# Patient Record
Sex: Female | Born: 1935 | Race: White | Hispanic: No | Smoking: Never smoker
Health system: Southern US, Community
[De-identification: ages and names within clinical notes are randomized; demographics above are authoritative.]

## PROBLEM LIST (undated history)

## (undated) DIAGNOSIS — K449 Diaphragmatic hernia without obstruction or gangrene: Secondary | ICD-10-CM

## (undated) DIAGNOSIS — E079 Disorder of thyroid, unspecified: Secondary | ICD-10-CM

## (undated) DIAGNOSIS — I701 Atherosclerosis of renal artery: Secondary | ICD-10-CM

## (undated) DIAGNOSIS — I6529 Occlusion and stenosis of unspecified carotid artery: Secondary | ICD-10-CM

## (undated) DIAGNOSIS — I509 Heart failure, unspecified: Secondary | ICD-10-CM

## (undated) DIAGNOSIS — I10 Essential (primary) hypertension: Secondary | ICD-10-CM

## (undated) DIAGNOSIS — N189 Chronic kidney disease, unspecified: Secondary | ICD-10-CM

## (undated) DIAGNOSIS — K219 Gastro-esophageal reflux disease without esophagitis: Secondary | ICD-10-CM

## (undated) HISTORY — DX: Occlusion and stenosis of unspecified carotid artery: I65.29

## (undated) HISTORY — PX: CORONARY ARTERY BYPASS GRAFT: SHX141

## (undated) HISTORY — DX: Disorder of thyroid, unspecified: E07.9

## (undated) HISTORY — DX: Gastro-esophageal reflux disease without esophagitis: K21.9

## (undated) HISTORY — DX: Essential (primary) hypertension: I10

## (undated) HISTORY — DX: Chronic kidney disease, unspecified: N18.9

## (undated) HISTORY — DX: Atherosclerosis of renal artery: I70.1

## (undated) HISTORY — DX: Heart failure, unspecified: I50.9

## (undated) HISTORY — DX: Diaphragmatic hernia without obstruction or gangrene: K44.9

---

## 1991-08-27 HISTORY — PX: ABDOMINAL HYSTERECTOMY: SHX81

## 2000-11-27 ENCOUNTER — Encounter: Payer: Self-pay | Admitting: Emergency Medicine

## 2000-11-27 ENCOUNTER — Inpatient Hospital Stay (HOSPITAL_COMMUNITY): Admission: EM | Admit: 2000-11-27 | Discharge: 2000-12-03 | Payer: Self-pay | Admitting: Emergency Medicine

## 2002-04-05 ENCOUNTER — Encounter: Payer: Self-pay | Admitting: Emergency Medicine

## 2002-04-05 ENCOUNTER — Emergency Department (HOSPITAL_COMMUNITY): Admission: EM | Admit: 2002-04-05 | Discharge: 2002-04-05 | Payer: Self-pay | Admitting: Emergency Medicine

## 2003-07-09 ENCOUNTER — Emergency Department (HOSPITAL_COMMUNITY): Admission: EM | Admit: 2003-07-09 | Discharge: 2003-07-09 | Payer: Self-pay | Admitting: Emergency Medicine

## 2004-01-22 ENCOUNTER — Emergency Department (HOSPITAL_COMMUNITY): Admission: EM | Admit: 2004-01-22 | Discharge: 2004-01-22 | Payer: Self-pay | Admitting: Emergency Medicine

## 2004-03-07 ENCOUNTER — Ambulatory Visit (HOSPITAL_COMMUNITY): Admission: RE | Admit: 2004-03-07 | Discharge: 2004-03-07 | Payer: Self-pay | Admitting: *Deleted

## 2004-03-16 ENCOUNTER — Ambulatory Visit (HOSPITAL_COMMUNITY): Admission: RE | Admit: 2004-03-16 | Discharge: 2004-03-17 | Payer: Self-pay | Admitting: *Deleted

## 2004-12-25 ENCOUNTER — Encounter: Admission: RE | Admit: 2004-12-25 | Discharge: 2004-12-25 | Payer: Self-pay | Admitting: Internal Medicine

## 2007-09-24 ENCOUNTER — Ambulatory Visit: Payer: Self-pay | Admitting: *Deleted

## 2008-03-10 ENCOUNTER — Ambulatory Visit: Payer: Self-pay | Admitting: *Deleted

## 2008-03-11 ENCOUNTER — Inpatient Hospital Stay (HOSPITAL_COMMUNITY): Admission: EM | Admit: 2008-03-11 | Discharge: 2008-03-16 | Payer: Self-pay | Admitting: Emergency Medicine

## 2008-03-12 ENCOUNTER — Encounter (INDEPENDENT_AMBULATORY_CARE_PROVIDER_SITE_OTHER): Payer: Self-pay | Admitting: Internal Medicine

## 2008-03-12 ENCOUNTER — Ambulatory Visit: Payer: Self-pay | Admitting: *Deleted

## 2008-09-15 ENCOUNTER — Ambulatory Visit: Payer: Self-pay | Admitting: *Deleted

## 2008-09-29 ENCOUNTER — Ambulatory Visit: Payer: Self-pay | Admitting: *Deleted

## 2008-10-17 ENCOUNTER — Encounter (INDEPENDENT_AMBULATORY_CARE_PROVIDER_SITE_OTHER): Payer: Self-pay | Admitting: *Deleted

## 2008-10-17 ENCOUNTER — Inpatient Hospital Stay (HOSPITAL_COMMUNITY): Admission: RE | Admit: 2008-10-17 | Discharge: 2008-10-18 | Payer: Self-pay | Admitting: *Deleted

## 2008-10-17 ENCOUNTER — Ambulatory Visit: Payer: Self-pay | Admitting: *Deleted

## 2008-10-17 HISTORY — PX: CAROTID ENDARTERECTOMY: SUR193

## 2008-11-03 ENCOUNTER — Ambulatory Visit: Payer: Self-pay | Admitting: *Deleted

## 2009-05-08 ENCOUNTER — Ambulatory Visit: Payer: Self-pay | Admitting: Vascular Surgery

## 2009-06-07 ENCOUNTER — Emergency Department (HOSPITAL_COMMUNITY): Admission: EM | Admit: 2009-06-07 | Discharge: 2009-06-08 | Payer: Self-pay | Admitting: Emergency Medicine

## 2009-06-19 ENCOUNTER — Observation Stay (HOSPITAL_COMMUNITY): Admission: EM | Admit: 2009-06-19 | Discharge: 2009-06-20 | Payer: Self-pay | Admitting: Emergency Medicine

## 2009-06-20 ENCOUNTER — Encounter (INDEPENDENT_AMBULATORY_CARE_PROVIDER_SITE_OTHER): Payer: Self-pay | Admitting: Internal Medicine

## 2009-11-21 ENCOUNTER — Ambulatory Visit: Payer: Self-pay | Admitting: Vascular Surgery

## 2010-07-06 ENCOUNTER — Ambulatory Visit: Payer: Self-pay | Admitting: Vascular Surgery

## 2010-09-16 ENCOUNTER — Encounter: Payer: Self-pay | Admitting: Internal Medicine

## 2010-11-29 LAB — URINE CULTURE
Colony Count: NO GROWTH
Colony Count: >=100000
Culture: NO GROWTH

## 2010-11-29 LAB — BASIC METABOLIC PANEL
BUN: 8 mg/dL (ref 6–23)
BUN: 12 mg/dL (ref 6–23)
CO2: 27 mEq/L (ref 19–32)
CO2: 25 mEq/L (ref 19–32)
Chloride: 105 mEq/L (ref 96–112)
GFR calc non Af Amer: 57 mL/min — ABNORMAL LOW (ref >60)
Glucose, Bld: 130 mg/dL — ABNORMAL HIGH (ref 70–99)
Potassium: 4.6 mEq/L (ref 3.5–5.1)
Sodium: 141 mEq/L (ref 135–145)

## 2010-11-29 LAB — DIFFERENTIAL
Basophils Absolute: 0 10*3/uL (ref 0.0–0.1)
Basophils Relative: 0 % (ref 0–1)
Eosinophils Absolute: 0.1 10*3/uL (ref 0.0–0.7)
Eosinophils Absolute: 0.1 10*3/uL (ref 0.0–0.7)
Eosinophils Relative: 2 % (ref 0–5)
Lymphocytes Relative: 9 % — ABNORMAL LOW (ref 12–46)
Lymphs Abs: 0.6 10*3/uL — ABNORMAL LOW (ref 0.7–4.0)
Monocytes Absolute: 0.3 10*3/uL (ref 0.1–1.0)
Monocytes Relative: 6 % (ref 3–12)
Neutro Abs: 5.1 10*3/uL (ref 1.7–7.7)
Neutrophils Relative %: 84 % — ABNORMAL HIGH (ref 43–77)

## 2010-11-29 LAB — POCT I-STAT, CHEM 8
BUN: 15 mg/dL (ref 6–23)
Calcium, Ion: 1.06 mmol/L — ABNORMAL LOW (ref 1.12–1.32)
Chloride: 105 mEq/L (ref 96–112)
Glucose, Bld: 123 mg/dL — ABNORMAL HIGH (ref 70–99)
HCT: 38 % (ref 36.0–46.0)
Potassium: 4.3 mEq/L (ref 3.5–5.1)
TCO2: 23 mmol/L (ref 0–100)

## 2010-11-29 LAB — URINALYSIS, ROUTINE W REFLEX MICROSCOPIC
Bilirubin Urine: NEGATIVE
Glucose, UA: NEGATIVE mg/dL
Glucose, UA: NEGATIVE mg/dL
Hgb urine dipstick: NEGATIVE
Hgb urine dipstick: NEGATIVE
Ketones, ur: NEGATIVE mg/dL
Nitrite: NEGATIVE
Nitrite: POSITIVE — AB
Protein, ur: NEGATIVE mg/dL
Specific Gravity, Urine: 1.012 (ref 1.005–1.030)
Urobilinogen, UA: 0.2 mg/dL (ref 0.0–1.0)
Urobilinogen, UA: 0.2 mg/dL (ref 0.0–1.0)
pH: 6 (ref 5.0–8.0)

## 2010-11-29 LAB — CBC
HCT: 36.7 % (ref 36.0–46.0)
HCT: 35.3 % — ABNORMAL LOW (ref 36.0–46.0)
Hemoglobin: 12.6 g/dL (ref 12.0–15.0)
Hemoglobin: 12.6 g/dL (ref 12.0–15.0)
Hemoglobin: 11.9 g/dL — ABNORMAL LOW (ref 12.0–15.0)
MCHC: 34.2 g/dL (ref 30.0–36.0)
MCHC: 34.2 g/dL (ref 30.0–36.0)
MCHC: 33.7 g/dL (ref 30.0–36.0)
MCV: 94.7 fL (ref 78.0–100.0)
MCV: 95.1 fL (ref 78.0–100.0)
MCV: 94.4 fL (ref 78.0–100.0)
Platelets: 175 10*3/uL (ref 150–400)
Platelets: 178 10*3/uL (ref 150–400)
Platelets: 164 10*3/uL (ref 150–400)
RBC: 3.86 MIL/uL — ABNORMAL LOW (ref 3.87–5.11)
RBC: 3.74 MIL/uL — ABNORMAL LOW (ref 3.87–5.11)
RDW: 13.6 % (ref 11.5–15.5)
RDW: 13.5 % (ref 11.5–15.5)
WBC: 5.6 10*3/uL (ref 4.0–10.5)
WBC: 6.2 10*3/uL (ref 4.0–10.5)

## 2010-11-29 LAB — POCT CARDIAC MARKERS
Myoglobin, poc: 84.5 ng/mL (ref 12–200)
Troponin i, poc: <0.05 ng/mL (ref 0.00–0.09)

## 2010-11-29 LAB — URINE MICROSCOPIC-ADD ON

## 2010-11-29 LAB — LIPID PANEL
LDL Cholesterol: 125 mg/dL — ABNORMAL HIGH (ref 0–99)
Total CHOL/HDL Ratio: 4 RATIO
Triglycerides: 61 mg/dL (ref <150)

## 2010-11-29 LAB — HOMOCYSTEINE: Homocysteine: 11.3 umol/L (ref 4.0–15.4)

## 2010-12-11 LAB — CBC
HCT: 30.9 % — ABNORMAL LOW (ref 36.0–46.0)
Hemoglobin: 10.6 g/dL — ABNORMAL LOW (ref 12.0–15.0)
MCHC: 34.3 g/dL (ref 30.0–36.0)
MCV: 94.8 fL (ref 78.0–100.0)
Platelets: 175 10*3/uL (ref 150–400)
RBC: 3.27 MIL/uL — ABNORMAL LOW (ref 3.87–5.11)
RDW: 13.9 % (ref 11.5–15.5)
RDW: 14.4 % (ref 11.5–15.5)
WBC: 5 10*3/uL (ref 4.0–10.5)

## 2010-12-11 LAB — URINALYSIS, ROUTINE W REFLEX MICROSCOPIC
Bilirubin Urine: NEGATIVE
Nitrite: POSITIVE — AB
Protein, ur: NEGATIVE mg/dL
Specific Gravity, Urine: 1.015 (ref 1.005–1.030)
Urobilinogen, UA: 0.2 mg/dL (ref 0.0–1.0)

## 2010-12-11 LAB — URINE MICROSCOPIC-ADD ON

## 2010-12-11 LAB — BASIC METABOLIC PANEL
CO2: 27 mEq/L (ref 19–32)
Calcium: 8.4 mg/dL (ref 8.4–10.5)
Chloride: 104 mEq/L (ref 96–112)
GFR calc Af Amer: >60 mL/min (ref >60)
Glucose, Bld: 119 mg/dL — ABNORMAL HIGH (ref 70–99)
Potassium: 4.1 mEq/L (ref 3.5–5.1)
Sodium: 137 mEq/L (ref 135–145)

## 2010-12-11 LAB — COMPREHENSIVE METABOLIC PANEL
AST: 22 U/L (ref 0–37)
Albumin: 3.7 g/dL (ref 3.5–5.2)
Alkaline Phosphatase: 62 U/L (ref 39–117)
Chloride: 103 mEq/L (ref 96–112)
GFR calc Af Amer: >60 mL/min (ref >60)
Potassium: 3.7 mEq/L (ref 3.5–5.1)
Total Bilirubin: 0.8 mg/dL (ref 0.3–1.2)
Total Protein: 6.3 g/dL (ref 6.0–8.3)

## 2010-12-11 LAB — APTT: aPTT: 31 seconds (ref 24–37)

## 2010-12-11 LAB — ABO/RH: ABO/RH(D): A POS

## 2010-12-11 LAB — TYPE AND SCREEN: Antibody Screen: NEGATIVE

## 2011-01-08 NOTE — H&P (Signed)
[name], [name]                ACCOUNT NO.:  [id number]   MEDICAL RECORD NO.:  [id number]          PATIENT TYPE:  INP   LOCATION:  2550                         FACILITY:  [facility]   PHYSICIAN:  Balinda Quails, M.D.    DATE OF BIRTH:  08-03-1918   DATE OF ADMISSION:  10/17/2008  DATE OF DISCHARGE:                              HISTORY & PHYSICAL   ADMISSION DIAGNOSIS:  Severe right internal carotid artery stenosis.   HISTORY:  [name] is an 77 year old female with a known carotid  vascular occlusive disease.  She has been followed in the office since  1998.  Underwent an arteriogram in 2005, this revealed approximately 70%  right internal carotid artery stenosis and conservative management was  pursued.  She is followed up regularly in the office with carotid  Dopplers.  She has remained asymptomatic.  Denies a history of stroke.  No sensory, motor, or visual deficit.   Recent carotid Doppler, however, revealed significant progression of  right internal carotid artery stenosis.  Doppler carried out on September 15, 2008 reveals velocities of 430/127 cm/sec in the right carotid  bifurcation with calcific plaque.   PAST MEDICAL HISTORY:  1. Noncardiac chest pain.  2. Peripheral vascular disease.  3. Hypertension.  4. Congestive heart failure.  5. Hypothyroidism.  6. Chronic kidney disease, stage III.   MEDICATIONS:  1. Aspirin 81 mg daily.  2. Levoxyl 75 mcg daily.   ALLERGIES:  CODEINE, MORPHINE, and LASIX.   SOCIAL HISTORY:  The patient was alone.  She has two very supportive  daughters.  Nonsmoker and nondrinker.   FAMILY HISTORY:  Noncontributory.   REVIEW OF SYSTEMS:  The patient denies recent chest pain or shortness of  breath.  No cough or sputum production.  No recent weight loss.  Regular  bowel habits.  No abdominal pain.  No dysuria or frequency.   PHYSICAL EXAMINATION:  GENERAL:  Well-appearing 77 year old female,  alert, and oriented.  No acute  distress.  VITAL SIGNS:  BP is 161/85, pulse is 62 per minute, respirations 20 per  minute, temperature 98, and O2 sat 100%.  HEENT:  Unremarkable.  NECK:  Supple.  No thyromegaly or adenopathy.  CARDIOVASCULAR:  Normal heart sounds without murmurs.  No gallops or  rubs.  Regular rate and rhythm.  CHEST:  Distant breath sounds.  Equal entry bilaterally.  No rales or  rhonchi.  ABDOMEN:  Soft and nontender.  No masses or organomegaly.  EXTREMITIES:  Full range of motion, no ankle edema.  NEUROLOGIC:  Cranial nerves intact.  Strength equal bilaterally.  1+  reflexes.   IMPRESSION:  1. Asymptomatic severe right internal carotid artery stenosis.  2. Hypertension.  3. Congestive heart failure.  4. Noncardiac chest pain.  5. Hypothyroidism.  6. Chronic kidney disease, stage III.   ADMISSION PLAN:  The patient will be electively admitted to [facility] for right carotid endarterectomy for reduction of stroke risk.      Balinda Quails, M.D.  Electronically Signed     PGH/MEDQ  D:  10/17/2008  T:  10/18/2008  Job:  [id number]   cc:   Gerlene Burdock A. Alanda Amass, M.D.

## 2011-01-08 NOTE — Procedures (Signed)
CAROTID DUPLEX EXAM   INDICATION:  Follow up carotid artery disease.   HISTORY:  Diabetes:  No.  Cardiac:  CABG.  Hypertension:  Yes.  Smoking:  No.  Previous Surgery:  No.  CV History:  No.  Amaurosis Fugax No, Paresthesias No, Hemiparesis No.                                       RIGHT             LEFT  Brachial systolic pressure:         150               122  Brachial Doppler waveforms:         Normal            Monophasic  Vertebral direction of flow:        Antegrade         Not visualized  DUPLEX VELOCITIES (cm/sec)  CCA peak systolic                   108               74  ECA peak systolic                   68                282  ICA peak systolic                   132               133  ICA end diastolic                   68                52  PLAQUE MORPHOLOGY:                  Calcific/irregular                  Calcific  PLAQUE AMOUNT:                      Moderate/severe   Moderate/severe  PLAQUE LOCATION:                    ICA/ECA/CCA       ICA/ECA/CCA   IMPRESSION:  1. 60-79% stenosis of the bilateral internal carotid arteries.  2. Turbulent, monophasic flow noted in the left subclavian artery,      which is suggestive of a significant proximal stenosis.  3. No significant change noted from the previous examination on      09/24/07.   ___________________________________________  P. Liliane Bade, M.D.   CH/MEDQ  D:  03/10/2008  T:  03/10/2008  Job:  [id number]

## 2011-01-08 NOTE — Assessment & Plan Note (Signed)
OFFICE VISIT   [name], [name]  DOB:  Nov 23, 1917                                       11/03/2008  NWGNF#:[id number]   The patient underwent right carotid endarterectomy 10/17/2008 at [facility].  This was carried out for severe asymptomatic right  internal carotid artery stenosis which had progressed.  She was  discharged home on postop day #1.  She has had some mild headache since  discharge.  No other complaints.   Right neck incision healing well.  BP is 154/83, pulse 73 per minute.  Cranial nerves intact.  Strength equal bilaterally.   The patient reassured regarding her headache.  Will plan followup in 6  months with carotid duplex.   Balinda Quails, M.D.  Electronically Signed   PGH/MEDQ  D:  11/03/2008  T:  11/04/2008  Job:  [id number]   cc:   Gerlene Burdock A. Alanda Amass, M.D.

## 2011-01-08 NOTE — Procedures (Signed)
CAROTID DUPLEX EXAM   INDICATION:  Followup carotid artery disease.   HISTORY:  Diabetes:  No.  Cardiac:  CABG.  Hypertension:  Yes.  Smoking:  No.  Previous Surgery:  No.  CV History:  No.  Amaurosis Fugax No, Paresthesias No, Hemiparesis No                                       RIGHT             LEFT  Brachial systolic pressure:         155               124  Brachial Doppler waveforms:         WNL               Monophasic  Vertebral direction of flow:        Antegrade         Not visualized  DUPLEX VELOCITIES (cm/sec)  CCA peak systolic                   67                76  ECA peak systolic                   175               252  ICA peak systolic                   430               319  ICA end diastolic                   127               71  PLAQUE MORPHOLOGY:                  Calcific          Calcific  PLAQUE AMOUNT:                      Severe            Moderate/severe  PLAQUE LOCATION:                    ICA/ECA/CCA       ICA/ECA/CCA   IMPRESSION:  1. Right ICA shows evidence of 80-99% stenosis at origin, an increase      from previous study.  2. Left ICA shows evidence of 60-79% stenosis and appears fairly      stable.  3. Left ECA stenosis.  4. Bilateral tortuous ICAs; left ICA acoustic shadowing.  5. Dr. Madilyn Fireman was informed of results and appointment scheduled to see      him 09/29/2008.   ___________________________________________  P. Liliane Bade, M.D.   AS/MEDQ  D:  09/15/2008  T:  09/15/2008  Job:  [id number]

## 2011-01-08 NOTE — Procedures (Signed)
CAROTID DUPLEX EXAM   INDICATION:  Follow up carotid artery disease.   HISTORY:  Diabetes:  No.  Cardiac:  CABG.  Hypertension:  Yes.  Smoking:  No.  Previous Surgery:  Right carotid endarterectomy, 10/17/2008, with a DPA  by Dr. Madilyn Fireman.  CV History:  Patient is currently asymptomatic.  Amaurosis Fugax No, Paresthesias No, Hemiparesis No.                                       RIGHT             LEFT  Brachial systolic pressure:         140               120  Brachial Doppler waveforms:         WNL               Monophasic  Vertebral direction of flow:        Antegrade         Not visualized  DUPLEX VELOCITIES (cm/sec)  CCA peak systolic                   48                62  ECA peak systolic                   64                271  ICA peak systolic                   49                143  ICA end diastolic                   15                30  PLAQUE MORPHOLOGY:                                    Heterogenous  PLAQUE AMOUNT:                                        Moderate  PLAQUE LOCATION:                                      ICA, ECA   IMPRESSION:  1. Patent and durable right carotid endarterectomy with no      hemodynamically significant stenosis.  2. 40% to 59% stenosis of the left internal carotid artery.  3. Left external carotid artery stenosis.  4. There is an improvement in velocities compared to previous studies.   ___________________________________________  Leonides Sake, MD   OD/MEDQ  D:  07/09/2010  T:  07/09/2010  Job:  [id number]

## 2011-01-08 NOTE — H&P (Signed)
[name], [name] NO.:  [id number]   MEDICAL RECORD NO.:  [id number]          PATIENT TYPE:  INP   LOCATION:  1830                         FACILITY:  [facility]   PHYSICIAN:  Della Goo, M.D. DATE OF BIRTH:  08/01/1918   DATE OF ADMISSION:  03/11/2008  DATE OF DISCHARGE:                              HISTORY & PHYSICAL   PRIMARY CARE PHYSICIAN:  Merlene Laughter. Renae Gloss, M.D. [facility].   CHIEF COMPLAINT:  Chest pain.   HISTORY OF PRESENT ILLNESS:  This is an 77 year old female presenting to  the emergency department with complaints of severe chest pain,  substernal in location without radiation.  This started at about 1:00  p.m.  She reports having associated symptoms of nausea and vomiting.  She called her daughters and told them of her symptoms and EMS was  called and the patient was transported to the emergency department at  about 4:56 p.m.  The patient also reported feeling sick since the a.m.  and feeling sluggish and just not feeling well.   PAST MEDICAL HISTORY:  Significant for coronary artery disease, history  of coronary artery bypass grafting in 1997, osteoporosis, and  hypothyroidism.   MEDICATIONS:  Include levothyroxine 0.1 mg 1 p.o. daily, aspirin 81 mg 1  p.o. daily and cranberry tablets 1 p.o. daily.   ALLERGIES:  CODEINE WHICH CAUSES NAUSEA AND VOMITING AND ALLERGY TO  LASIX.  THIS IS AN INTOLERANCE.  HER DAUGHTER'S REPORT THAT SHE GETS  SEVERE CRAMPING AND CONFUSION.   SOCIAL HISTORY:  The patient lives alone.  She is a nonsmoker,  nondrinker.   FAMILY HISTORY:  Noncontributory.   REVIEW OF SYSTEMS:  Pertinents are mentioned above.   PHYSICAL EXAMINATION:  This is an elderly 77 year old female in no  discomfort or acute distress currently.  VITAL SIGNS:  Temperature 97.4, blood pressure 150/93, heart rate 85,  respirations 18, O2 sats 94% to 98%.  HEENT:  Normocephalic, atraumatic.   Pupils are equally round, reactive  to light.  Extraocular movements are intact.  Funduscopic benign.  There  is no scleral icterus.  Oropharynx is clear.  The patient is edentulous.  She has dentures on the upper and lower.  NECK:  Supple.  Full range of motion.  No thyromegaly, adenopathy or  jugular venous distention.  CARDIOVASCULAR:  Regular rate and rhythm.  No murmurs, gallops or rubs.  LUNGS:  Clear to auscultation bilaterally.  ABDOMEN:  Positive bowel sounds, soft, nontender, nondistended.  EXTREMITIES:  Without cyanosis, clubbing or edema.  NEUROLOGIC:  Generalized weakness but otherwise nonfocal.   LABORATORY STUDIES:  White blood cell count 5.9, hemoglobin 12.0,  hematocrit 35.3, platelets 168, neutrophils 75%, lymphocytes 16%.  Sodium 138, potassium 4.2, chloride 105.  BUN 21, creatinine 1.4, bicarb  24 and glucose 135.  Cardiac enzymes with a myoglobin of 99.7, CK MB  less than 1.0, troponin less than 0.05.  Beta natriuretic peptide 190.0.  Chest x-ray reveals stable cardiomegaly and vascular congestion and also  a moderate to large hiatal hernia.  EKG reveals a normal  sinus rhythm,  without acute ST segment changes.   ASSESSMENT:  An 77 year old female being admitted with:   1. Chest pain.  2. Nausea and vomiting.  3. Hypertension.  4. Mild congestive heart failure, left-sided  5. Hypothyroidism history.   PLAN:  The patient will be admitted to telemetry area.  Cardiac enzymes  will be performed.  The patient will be placed on nitrates, oxygen and  aspirin therapy.  Beta blocker therapy has also been ordered.  A 2D echo  will also be ordered.  Gentle diuresis will be initiated.  The patient  will be placed on DVT and GI prophylaxis.  Antiemetics have also been  ordered and pain control therapy as needed.  The patient has been  administered Bumex x1 in the emergency department.      Della Goo, M.D.  Electronically Signed     HJ/MEDQ  D:  03/11/2008   T:  03/11/2008  Job:  [id number]   cc:   Merlene Laughter. Renae Gloss, M.D.

## 2011-01-08 NOTE — Assessment & Plan Note (Signed)
OFFICE VISIT   KEYOSHA, TIEDT  DOB:  Nov 08, 1917                                       09/29/2008  EAVWU#:[id number]   The patient is an 77 year old female with known carotid vascular  occlusive disease.  She has been followed in the office with this  diagnosis since 1998.  She underwent an arteriogram in 2005, this  however revealed approximately a 70% right internal carotid artery  stenosis and conservative treatment was pursued.  She is followed up  regularly in the office with carotid Dopplers.  She has had no symptoms.  Denies stroke.  No history of visual, sensory or motor deficit.   She is an 77 year old female who is alert and oriented.  BP is 128/83,  pulse is 70 per minute.  Her neck reveals a right carotid bruit.  Cranial nerves are intact.  Strength equal bilaterally.  1+ reflexes.   Overall the patient is doing fairly well other than evidence of  significant progression of her right carotid stenosis.  Doppler was  carried out 09/15/2008 which reveals velocities now measuring 430/127  cm/sec with severe right carotid bifurcation and calcific plaque.   I have suggested we go ahead with a right carotid endarterectomy and  discussed the potential risks of this.  She is going to have a stress  test at [facility] prior to her surgery which is scheduled for 10/17/2008 at  [facility].   Balinda Quails, M.D.  Electronically Signed   PGH/MEDQ  D:  09/29/2008  T:  09/29/2008  Job:  [id number]   cc:   Gerlene Burdock A. Alanda Amass, M.D.

## 2011-01-08 NOTE — Discharge Summary (Signed)
[name], [name]                ACCOUNT NO.:  [id number]   MEDICAL RECORD NO.:  [id number]          PATIENT TYPE:  INP   LOCATION:  3301                         FACILITY:  [facility]   PHYSICIAN:  Balinda Quails, M.D.    DATE OF BIRTH:  1918/03/21   DATE OF ADMISSION:  10/17/2008  DATE OF DISCHARGE:  10/18/2008                               DISCHARGE SUMMARY   ADMISSION DIAGNOSIS:  Severe right internal carotid artery stenosis.   FINAL DISCHARGE DIAGNOSES:  1. Severe right internal carotid artery stenosis status post right      carotid endarterectomy.  2. History of noncardiac chest pain.  3. Peripheral vascular disease.  4. Hypertension.  5. Congestive heart failure.  6. Hypothyroidism.  7. Chronic kidney disease stage III.  8. Allergy to CODEINE, MORPHINE and LASIX.   PROCEDURE:  October 17, 2008 right carotid endarterectomy with Dacron  patch angioplasty by Dr. Denman George.   BRIEF HISTORY:  [name] is an 77 year old female with history of known  carotid occlusive disease.  She underwent arteriogram in 2005 which  revealed right internal carotid artery stenosis at approximately 70%.  She was followed in [facility] office with serial Doppler and most recently  showed progression to severe stenosis.  Dr. Madilyn Fireman recommended elective  right carotid endarterectomy to reduce her risk for future stroke.   HOSPITAL COURSE:  [name] was electively admitted to the [facility] on October 17, 2008.  She underwent the previously mentioned  procedure.  Postoperatively, she was extubated, neurologically intact,  after short stay in recovery unit was transferred to step-down unit 3300  where she remained until discharge.  She had a noneventful postoperative  course.  Vitals remained stable and neurologically she remained intact.  Her right neck incision was intact without evidence of hematoma.  Postoperative labs showed sodium 137, potassium 4.1, chloride 104, CO2  27, glucose  119, BUN 10, creatinine 0.83, calcium 8.4, white count 6.3,  hemoglobin 10.6, hematocrit 30.9, platelet count of 149.  Morning  postoperative day #1 she was able to ambulate and void without  difficulty and tolerate regular diet and was subsequently felt  appropriate for discharge home on postoperative day #1 October 18, 2008  in stable and improving condition.   DISCHARGE MEDICATIONS:  1. Ultram 50 mg 1 tablet p.o. q.4 h. p.r.n. pain.  2. Levoxyl 75 mcg daily.  3. Aspirin 81 mg daily.   DISCHARGE INSTRUCTION:  She continue heart-healthy diet, shower and  clean her incisions gently with soap and water.  Avoid driving or heavy  lifting for the next couple of weeks.  Call if she has fever greater  than 101, redness or drainage from incision sites or any neurologic  changes.  She should otherwise see Dr. Madilyn Fireman in 2-3 weeks, she should  call sooner if needed.      Jerold Coombe, P.A.      Balinda Quails, M.D.  Electronically Signed    AWZ/MEDQ  D:  10/18/2008  T:  10/18/2008  Job:  [id number]   cc:   Gerlene Burdock A.  Alanda Amass, M.D.  Merlene Laughter. Renae Gloss, M.D.  [facility]

## 2011-01-08 NOTE — Op Note (Signed)
NAMEGISELA, LEA                ACCOUNT NO.:  [id number]   MEDICAL RECORD NO.:  [id number]          PATIENT TYPE:  INP   LOCATION:  3301                         FACILITY:  [facility]   PHYSICIAN:  Balinda Quails, M.D.    DATE OF BIRTH:  1917-09-06   DATE OF PROCEDURE:  10/17/2008  DATE OF DISCHARGE:                               OPERATIVE REPORT   SURGEON:  Balinda Quails, MD   ASSISTANT:  Nurse.   ANESTHETIC:  General endotracheal.   ANESTHESIOLOGIST:  Sheldon Silvan, MD   PREOPERATIVE DIAGNOSIS:  Severe right internal carotid artery stenosis.   POSTOPERATIVE DIAGNOSIS:  Severe right internal carotid artery stenosis.   PROCEDURE:  Right carotid endarterectomy with Dacron patch angioplasty.   CLINICAL NOTE:  Meigan Pates is an 77 year old female with history of  known carotid occlusive disease.  Underwent an arteriogram in 2005 which  revealed this to be right internal carotid artery to have approximately  70% stenosis.  Followed in the office with serial Doppler, has now  developed a severe stenosis.  She is consented for right carotid  endarterectomy for reduction stroke risk.  Risks of the operative  procedure was reviewed the patient preoperatively, potential risks  including MI, CVA, cranial nerve injury, and death were reviewed.   OPERATIVE PROCEDURE:  The patient was brought to the operating room in  stable condition.  Placed under general endotracheal anesthesia.  A  Foley catheter and arterial line were in place.  The right neck prepped  and draped in sterile fashion.   A curvilinear skin incision made along the anterior border of the right  sternomastoid muscle.  Subcutaneous tissue and platysma were divided  with electrocautery.  Deep dissection carried down, the facial vein  ligated with 2-0 silk and divided.  The carotid bifurcation exposed.  The common carotid artery mobilized down to the omohyoid muscle and  encircled with a vessel loop.  The vagus nerve reflected  posteriorly and  preserved.  The superior thyroid and external carotid arteries were  freed and encircled with vessel loops.  The internal carotid artery  followed distally up to the posterior belly of digastric muscle.  The  hypoglossal nerve was reflected superiorly and preserved.  The distal  internal carotid artery encircled with a vessel loop.   Evaluation of the carotid bifurcation did reveal calcified plaque with  mild-to-moderate tortuosity of the internal carotid artery.  The patient  administered 5000 units of heparin intravenously.  Adequate circulation  time permitted.   Carotid vessels controlled with clamps.  A longitudinal arteriotomy made  in the distal common carotid artery.  The arteriotomy extended across  carotid bulb and up into the internal carotid artery.  There was an  ulcerated plaque with a high-grade right internal carotid artery  stenosis present.  Partial calcification of the plaque noted.   A shunt was inserted.   An endarterectomy elevator was then used to remove the plaque.  The  endarterectomy carried down into the common carotid artery with the  plaque and was divided transversely with Potts scissors.  Plaque raised  up  to the bulb.  The superior thyroid and external carotid were  endarterectomized using an eversion technique.  The distal internal  carotid artery plaque feathered out well.   Fragments of plaque removed with fine forceps.  The site was irrigated  with heparin saline solution.   A patch angioplasty of the endarterectomy site was then carried out  using a Finesse Dacron patch, with running 6-0 Prolene.  At the  completion of the patch angioplasty, the shunt removed, all vessels were  well flushed.  Antegrade flow directed up the external carotid artery,  internal carotid then released.  Excellent pulse and Doppler signal in  the distal internal carotid artery.  The patient administered 50 mg of  protamine intravenously.  Adequate  hemostasis obtained.   The sternomastoid fascia closed with running 2-0 Vicryl suture.  Platysma closed with running 3-0 Vicryl suture.  Skin closed with 4-0  Monocryl.  Dermabond applied.   The patient tolerated the procedure well.  There were no apparent  complications.  Neurologically intact, transferred to the recovery room.      Balinda Quails, M.D.  Electronically Signed     PGH/MEDQ  D:  10/17/2008  T:  10/18/2008  Job:  [id number]   cc:   Gerlene Burdock A. Alanda Amass, M.D.

## 2011-01-08 NOTE — Procedures (Signed)
CAROTID DUPLEX EXAM   INDICATION:  Follow up carotid artery disease.   HISTORY:  Diabetes:  No.  Cardiac:  CABG.  Hypertension:  Yes.  Smoking:  No.  Previous Surgery:  On 10/17/08, CEA with DPA by Dr. Madilyn Fireman.  CV History:  No.  Amaurosis Fugax No, Paresthesias No, Hemiparesis No.                                       RIGHT             LEFT  Brachial systolic pressure:         155               130  Brachial Doppler waveforms:         WNL               Monophasic  Vertebral direction of flow:        Antegrade         Not visualized  DUPLEX VELOCITIES (cm/sec)  CCA peak systolic                   64                M = 66, D = 161  ECA peak systolic                   75                323  ICA peak systolic                   70                271  ICA end diastolic                   26                78  PLAQUE MORPHOLOGY:                                    Calcific  PLAQUE AMOUNT:                      None in ICA       Moderate/severe  PLAQUE LOCATION:                    CCA               ICA/ECA/CCA   IMPRESSION:  1. Right internal carotid artery shows no evidence of restenosis,      status post carotid endarterectomy.  2. Left internal carotid artery shows evidence of 60-79% stenosis.  3. Left external carotid artery stenosis.  4. Left distal common carotid artery stenosis.  5. Bilateral tortuous internal carotid arteries with left bifurcation      very distal with acoustic shadowing.         ___________________________________________  Janetta Hora. Fields, MD   AS/MEDQ  D:  05/08/2009  T:  05/09/2009  Job:  [id number]

## 2011-01-08 NOTE — Procedures (Signed)
CAROTID DUPLEX EXAM   INDICATION:  Followup, carotid artery disease.   HISTORY:  Diabetes:  No.  Cardiac:  CABG.  Hypertension:  Yes.  Smoking:  No.  Previous Surgery:  No.  CV History:  No.  Amaurosis Fugax No, Paresthesias No, Hemiparesis No                                       RIGHT             LEFT  Brachial systolic pressure:         160               130  Brachial Doppler waveforms:         Triphasic         Monophasic  Vertebral direction of flow:        Antegrade         Not detected  DUPLEX VELOCITIES (cm/sec)  CCA peak systolic                   61                61  ECA peak systolic                   168               246  ICA peak systolic                   239               256  ICA end diastolic                   66                66  PLAQUE MORPHOLOGY:                  Calcific          Calcific  PLAQUE AMOUNT:                      Moderate/severe   Moderate/severe  PLAQUE LOCATION:                    ICA/ECA/CCA       ICA/ECA/CCA   IMPRESSION:  1. Bilateral internal carotid artery stenosis of 60-79%.  2. Left external carotid artery stenosis.  3. Left vertebral artery not detected, vertebral artery is antegrade.  4. Known left subclavian artery stenosis.  5. Extremely tortuous carotid vessels, especially the left.  6. No significant changes from previous study.   ___________________________________________  P. Liliane Bade, M.D.   AS/MEDQ  D:  09/24/2007  T:  09/24/2007  Job:  [id number]

## 2011-01-08 NOTE — Discharge Summary (Signed)
[name], FRICK NO.:  [id number]   MEDICAL RECORD NO.:  [id number]          PATIENT TYPE:  INP   LOCATION:  3011                         FACILITY:  [facility]   PHYSICIAN:  Altha Harm, MDDATE OF BIRTH:  07-21-1918   DATE OF ADMISSION:  03/11/2008  DATE OF DISCHARGE:                               DISCHARGE SUMMARY   DISCHARGE DISPOSITION:  Home.   FINAL DISCHARGE DIAGNOSES:  1. Chest pain, noncardiac.  2. Peripheral vascular disease.  3. Elevated D-dimer.  4. Hypertension.  5. Mild diastolic congestive heart failure.  6. Hypothyroidism.  7. Chronic kidney disease, stage III.   DISCHARGE MEDICATIONS:  1. Aspirin 81 mg p.o. daily.  2. Levothyroxine 100 mcg p.o. daily.  3. Metoprolol 25 mg p.o. b.i.d.  4. Cranberry supplement.   CONSULTANTS:  Dr. Lynnea Ferrier,  Cardiology.   PROCEDURES:  Stress test done on July 20, which showed no stress induced  ischemia and no reversible wall motion abnormalities.   DIAGNOSTIC STUDIES:  1. Perfusion scan, which was intermediate for pulmonary embolus.  2. Chest x-ray two-view which showed significant interval improvement      in congestive heart failure, small residual pleural effusions and      large hiatal hernia.  3. Chest x-ray portable one-view on admission which shows vascular      congestion with possible mild edema and there is some bibasilar      atelectasis.  4. CT chest without contrast done on July 17 which shows no definite      acute chest findings. Demonstrated noncontrast imaging.  No      evidence of aortic dissection on lung contrast imaging.   CHIEF COMPLAINT:  Chest pain.   HISTORY OF PRESENT ILLNESS:  Please see the H&P dictated by Dr. Lovell Sheehan  for details of the HPI.   HOSPITAL COURSE:  1. The chest pain.  The patient was admitted via the emergency room.      The patient had point of care enzymes and a D-dimer performed in      the emergency room.  Point of care enzymes were within  normal      limits and then the patient had her enzymes further cycled to rule      out resting ischemia.  Resting ischemia was ruled out and the      patient underwent a stress test under the consultation of Dr.      Lynnea Ferrier.  The findings are as noted above.  2. Elevated D-dimer. The patient had an elevated D-dimer and in light      of her presenting tachycardia, the idea of pulmonary embolus was      entertained. The patient was unable to undergo CT angiogram      secondary to her decreased renal function and thus a VQ scan was      scheduled.  The VQ scan had to be delayed secondary to the use of      radioisotopes for the stress test and the need to separate despite      48 hours.  The VQ scan showed  intermediate probability.  This was      discussed at length with the patient and her daughter who both were      in agreement that given the fact that index of suspicion was low      they did not want to proceed with any further testing or any      anticoagulation at this time.  The signs and symptoms of acute PE      were discussed ad nauseam with the patient and her daughter and      also the risk of the patient having chronic PE at this patient's      age.  Thus, the patient will not undergo any anticoagulation at      this time.  In terms of her diastolic dysfunction, the patient      started on beta blocker in the hospital and will be continued on      that as an outpatient.  The patient is to follow up with Dr.      Renae Gloss for evaluation of her blood pressure and her heart rate      within 1-2 weeks.  The patient is very reluctant to take any new      medications and feels that she has been getting along fine with the      usual medications that she has.  Given her age, she has wisely      decided that she wants no extensive investigations done for her.      Thus, the patient has elected that she will not pursue taking      Plavix as recommended by Dr. Lynnea Ferrier and she is unsure  as to      whether or not she will take metoprolol although it is recommended      given her tachycardia and her hypertension.  The patient is at this      time is stable.   Today, her vital signs follows: Blood pressure 110/63, heart rate 70,  temperature 97.7, respiratory rate 18, O2 sats are 96% on room air.  LUNGS:  Clear to auscultation.  She is ambulating without any difficulty.  She has got normal S1-S2; no murmurs, rubs or gallops are noted.  ABDOMEN: Abdomen is soft, nontender, nondistended.  No masses.  No  hepatosplenomegaly.  She has no swelling cyanosis or edema in the legs.   In terms of dietary restrictions, the patient has no dietary  restrictions and is given a liberal diet given her lack of oral intake  on a consistent basis.  In terms of physical restrictions, the patient  should ambulate with assistance. However, does not want any physical  therapy, occupational therapy for home and has declined it.   FOLLOW-UP:  The patient is to follow up with Dr. Renae Gloss in his office 1-  2 weeks.      Altha Harm, MD  Electronically Signed     MAM/MEDQ  D:  03/16/2008  T:  03/16/2008  Job:  [id number] [id number]   cc:   Merlene Laughter. Renae Gloss, M.D.

## 2011-01-08 NOTE — Procedures (Signed)
CAROTID DUPLEX EXAM   INDICATION:  Follow up carotid artery disease.   HISTORY:  Diabetes:  No  Cardiac:  CABG  Hypertension:  Yes  Smoking:  No  Previous Surgery:  10/17/2008, right carotid endarterectomy with DPA by  Dr. Madilyn Fireman.  CV History:  No  Amaurosis Fugax No, Paresthesias No, Hemiparesis No                                       RIGHT             LEFT  Brachial systolic pressure:         140               120  Brachial Doppler waveforms:         WNL               monophasic  Vertebral direction of flow:        antegrade         not visualized  DUPLEX VELOCITIES (cm/sec)  CCA peak systolic                   78                74  ECA peak systolic                   86                326  ICA peak systolic                   66                229  ICA end diastolic                   21                65  PLAQUE MORPHOLOGY:                                    calcific  PLAQUE AMOUNT:                                        moderate  PLAQUE LOCATION:                                      ICA, ECA   IMPRESSION:  1. Right internal carotid artery appears patent status post carotid      endarterectomy with no evidence of restenosis.  2. Left internal carotid artery suggests 60%-79% stenosis.  3. Left external carotid artery stenosis.  4. Left vertebral not visualized.   ___________________________________________  Di Kindle. Edilia Bo, M.D.   CB/MEDQ  D:  11/21/2009  T:  11/22/2009  Job:  [id number]

## 2011-01-08 NOTE — Assessment & Plan Note (Signed)
OFFICE VISIT   [name], [name]  DOB:  1918-03-03                                       07/06/2010  EAVWU#:[id number]   This is an established patient.   HISTORY OF PRESENT ILLNESS:  This is a 77 year old female who presents  for chief complaint of followup on bilateral carotid stenosis.  This  patient previously underwent a right carotid endarterectomy 10/17/2008.  Her last vascular study was completed on 11/21/2009 and at that point it  demonstrated no recurrence of any stenosis in the right carotid artery  and a 60%-79% stenosis in the left internal carotid artery.  At this  point the patient is completely asymptomatic, no TIA or stroke symptoms.  She notes no amaurosis fugax.  No monocular blindness.  No facial  drooping.  No hemiplegia.  No expressive or receptive aphasia.   Her carotid risk factors include hyperlipidemia, hypertension.  She does  not smoke or ever smoked and has no diabetes.  Risk factor management  includes taking aspirin and Norvasc for hypertension.  Otherwise the  rest of her neurologic review of systems was negative.   PAST MEDICAL HISTORY:  Coronary artery disease, congestive heart  failure, hypothyroidism, gastroesophageal reflux disease, hiatal hernia,  left subclavian artery stenosis, chronic kidney disease, hypertension,  renal stenosis.   PAST SURGICAL HISTORY:  Included a previously noted right carotid  endarterectomy and a CABG times three vessels.   SOCIAL HISTORY:  She is widowed, five children, retired, never smoked.  No alcohol or illicit drug use.   FAMILY HISTORY:  Mom and dad died of old age.   MEDICATIONS:  Included Synthroid, aspirin, amlodipine.   ALLERGIES:  Her allergies are to Lasix and codeine.   REVIEW OF SYSTEMS:  Today she noted no symptoms on her 12 point review  of systems.  She does not have any left arm complaints.   PHYSICAL EXAMINATION:  Vital signs:  She had a blood pressure 130/71 in  the right arm and 103/69, heart rate was 69 and respirations were 12.  General:  Alert and oriented x3, well-developed, well-nourished.  Head:  Normocephalic, atraumatic.  Mild temporalis wasting.  ENT:  Hearing grossly intact.  Nares without any erythema or drainage.  Oropharynx without any erythema or exudate.  She has dentures in place.  Eyes:  Pupils were equal, round, reactive to light.  Extraocular  movements were intact.  Neck:  She had a supple neck.  No nuchal rigidity.  No palpable  lymphadenopathy.  Pulmonary:  Symmetric expansion, good air movement.  Clear to  auscultation bilaterally.  No rales, rhonchi or wheezing.  Cardiac:  Regular rate and rhythm.  Normal S1-S2.  No murmurs, rubs,  thrills or gallops.  Vascular:  She had easily palpable upper extremity  pulses, palpable carotid pulses.  There were bruits bilaterally.  Actually the right bruit is stronger than the left.  Her aorta was  easily palpable within normal size grossly.  Lower extremities had  easily palpable femoral, popliteal and pedal pulses.  GI:  Soft abdomen, nontender, nondistended.  No guarding, rebound.  No  hepatosplenomegaly.  No masses.  Musculoskeletal:  On motor strength she had 5/5 strength throughout.  There are no signs of gangrene or ulceration of any extremity.  Neurological:  Cranial nerves II-XII were intact.  Sensation was intact  in all extremities.  Motor scan was as above.  Psychiatric:  Judgment was intact.  Mood and affect were appropriate for  her clinical situation.  Skin:  Extremities were as listed above.  Otherwise she had no obvious  rashes elsewhere.  Lymphatic:  She had no lymphadenopathy on the cervical, axillary or  inguinal basis.   Noninvasive vascular imaging:  I reviewed her bilateral carotid  duplexes.  On the right side she has a peak systolic of 49 with a  diastolic of 15.  This is consistent with no disease on the right  internal carotid artery status post carotid  endarterectomy.  On the left  side she has a peak systolic velocity of 143 over diastolic of 30.  There is a moderate amount of heterogeneous plaque without any obvious  calcification.  Interpretation of this would be 40%-59% stenosis of the  left internal carotid artery.  Comparing previously she had a one grade  higher, at that point her peak systolic was 229.  There is nothing noted  for the common carotid artery velocity .  I suspect that this evaluation  may be in error as the two previous duplexes have demonstrated 60%-79%  stenosis.  This can be accounted for by the angle of insonation or a  lower common carotid artery velocity.   MEDICAL DECISION MAKING:  This is a 77 year old female status post right  carotid endarterectomy without evidence of recurrence of her internal  carotid stenosis on this side.  She does have left-sided internal  carotid stenosis that previously has been 60%-79%.  I suspect that the  repeat duplex this time is in error.  However, she is asymptomatic.  I  do not find a compelling reason to immediately repeat this.  I would  continue her on protocol at 6 months and at that point if she continues  to have stable 60%-79% stenosis I would not intervene in her case.  She  is to continue maximal medical therapy as we discussed and will continue  to follow along with her.     Leonides Sake, MD  Electronically Signed   BC/MEDQ  D:  07/06/2010  T:  07/09/2010  Job:  [id number]

## 2011-01-11 ENCOUNTER — Other Ambulatory Visit (INDEPENDENT_AMBULATORY_CARE_PROVIDER_SITE_OTHER): Payer: Medicare HMO

## 2011-01-11 DIAGNOSIS — I6529 Occlusion and stenosis of unspecified carotid artery: Secondary | ICD-10-CM

## 2011-01-11 DIAGNOSIS — Z48812 Encounter for surgical aftercare following surgery on the circulatory system: Secondary | ICD-10-CM

## 2011-01-11 NOTE — Consult Note (Signed)
NAMEMARGRETTA, [name]                          ACCOUNT NO.:  [id number]   MEDICAL RECORD NO.:  [id number]                   PATIENT TYPE:  OIB   LOCATION:  2899                                 FACILITY:  [facility]   PHYSICIAN:  Janeece Riggers. Karin Golden, M.D.                DATE OF BIRTH:  12/31/1917   DATE OF CONSULTATION:  03/11/2004  DATE OF DISCHARGE:  03/07/2004                                   CONSULTATION   REASON FOR CONSULTATION:  This is a consultation for interpretation of  intracranial views from a cerebral arteriogram done on March 07, 2004, by  Drs. Samule Ohm and [place].   HISTORY:  Progressive asymptomatic carotid stenosis.   Right internal carotid arteriogram:  This vessel was opacified via a common  carotid injection.  This was a tortuous and ectatic vessel which is widely  patent into the brain.  There is atherosclerotic irregularity in the carotid  siphon.  I think there is moderate narrowing in the siphon region, but this  probably not flow-limiting.  Flow from this injection supplies the right  middle and posterior cerebral arteries and gives supply to both anterior  cerebral arteries.  There is no sign of intracranial aneurysm, stenosis, or  vascular malformation.   Left internal carotid arteriogram:  This vessel was opacified via a common  carotid injection.  The internal carotid artery was widely patent into the  brain without siphon stenosis.  Flow from this injection supplies the left  middle and anterior cerebral artery territories and contributes to the right  anterior cerebral artery territory via a patent anterior communicating  artery.  There is no evidence of intracranial stenosis, aneurysm, or  vascular malformation.  The parenchymal and venous phases are normal.   IMPRESSION:  Normal intracranial anterior circulation with the exception  that I think there is a moderate stenosis of the cavernous internal carotid  artery on the right.  I doubt this is flow-limiting,  however.  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                                               Mark E. Karin Golden, M.D.    MES/MEDQ  D:  03/11/2004  T:  03/11/2004  Job:  [id number]

## 2011-01-11 NOTE — Discharge Summary (Signed)
[place]  Patient:    [name], [name]                       MRN: [id number] Adm. Date:  [id number] Disc. Date: [id number] Attending:  Ruta Hinds Dictator:   Halford Decamp Delanna Ahmadi, R.N., N.P. CC:         Barbette Hair. Arlyce Dice, M.D. [facility]  Juluis Mire, M.D.   Discharge Summary  HISTORY OF PRESENT ILLNESS:  [name] is an 77 year old white widowed female patient with three living children, two deceased, 80 grandchildren, and 30 great-grandchildren, who came to the emergency room with complaints of chest tightness, feeling sick and nauseated.  She did have a prior history of coronary artery disease with a CABG x 3 in October 1997, by Dr. Tyrone Sage, with LIMA to her LAD, SVG to her OM, and SVG to her PDA.  HOSPITAL COURSE:  She was admitted with unstable angina.  She was placed on IV heparin, put her on TM medication, and scheduled her for a cardiac catheterization.  She was seen by Dr. Susa Griffins in the emergency room. On November 28, 2000, she underwent cardiac catheterization by Dr. Susa Griffins.  Her LIMA to her LAD was patent.  She did have a 60% blockage in her native LAD past the graft.  Her SVG to her circumflex was patent.  Her svg to her RCA had a 95% lesion at the anastomotic site to her RCA.  She underwent angioplasty, reduced from 95 to less than 20%.  She also had native vessel disease with 80% left main, 80% LAD, a ______ circumflex, and an 85% in her native mid RCA.  She was also found to have peripheral vascular disease with a 90% right renal artery stenosis, 40-50% left subclavian artery stenosis, 90% left subclavian after her LIMA, 85% right subclavian artery stenosis, and she had bilateral carotid stenosis greater than 85%, and a large hiatal hernia was seen.  A GI consult was called because of her low hemoglobin.  They recommended outpatient workup after she was off her antiplatelet medications with serial Hemoccults.   She was also seen by Dr. Madilyn Fireman secondary to her bilateral carotid stenosis.  He recommended carotid Doppler studies.  These were performed December 01, 2000.  She was found to have moderate to severe calcific plaque, 60-80% right greater than left, bilateral external carotid artery stenosis left greater than right.  Left vertebral flow was antegrade. Left vertebral artery was not ______ .  She was seen by Dr. Susa Griffins on December 03, 2000, and considered stable to be discharged home.  Follow up with Dr. Arlyce Dice as an outpatient.  LABORATORY DATA:  December 03, 2000, her hemoglobin was 11.9, hematocrit 35, platelets 183.  Sodium 135, potassium 3.6, BUN 24, creatinine 1.0.  CK-MBs were negative x 2.  First troponin was 0.01, second was 0.05, and third was 0.05.  Two further CK-MBs were both negative.  Total cholesterol was 148, triglycerides 54, LDL was 44, and LDL was 93.  TSH was less than 0.019, which was low.  Her iron was 40, TIBC was within normal limits at 289, percent saturation was 14, ferritin was 9, which was low.  Folate was 15.  DISCHARGE MEDICATIONS: 1. Enteric-coated aspirin once per day. 2. Lopressor 25 mg twice per day. 3. Plavix 75 mg once per day. 4. Imdur 30 mg once per day. 5. Altace 5 mg once per day. 6. Synthroid 100 mcg  once per day. 7. Nu-Iron 150 mg once per day. 8. Protonix 40 mg once per day. 9. Gas-X at bedtime when needed.  ACTIVITY:  As tolerated.  DIET:  She should be on a low fat diet, low salt.  FOLLOW-UP:  With Dr. Arlyce Dice in one month for her further GI evaluation.  She is to follow up with her primary care physician, Dr. Ellene Route, and will follow up with Dr. Alanda Amass.  DISCHARGE DIAGNOSES: 1. Unstable angina. 2. Coronary disease, status post cardiac catheterization November 28, 2000, and    subsequent intervention with an angioplasty to anastomosed site of her    saphenous vein graft to her right coronary artery, reduced from 95 to less     than 20. 3. Previous coronary artery bypass grafting 1997, with left internal mammary    artery to her left anterior descending, saphenous vein graft to her obtuse    marginal, and saphenous vein graft to her posterior descending artery, by    Dr. Tyrone Sage. 4. Arteriosclerotic peripheral vascular disease with bilateral carotid    stenosis, bilateral subclavian stenosis, and right renal artery stenosis. 5. Normal left ventricular function with an ejection fraction of 55-50%. 6. Hypothyroidism, on replacement with low thyroid stimulating hormone,    medication adjusted. 7. Paroxysmal atrial fibrillation, prior history, normal sinus rhythm while in    the hospital. 8. Anemia. 9. Hiatal hernia. DD:  12/31/00 TD:  01/01/01 Job: [id number]

## 2011-01-11 NOTE — Op Note (Signed)
NAMEWHITNI, PASQUINI                          ACCOUNT NO.:  [id number]   MEDICAL RECORD NO.:  [id number]                   PATIENT TYPE:  OIB   LOCATION:  2899                                 FACILITY:  [facility]   PHYSICIAN:  Balinda Quails, M.D.                 DATE OF BIRTH:  Jun 11, 1918   DATE OF PROCEDURE:  03/07/2004  DATE OF DISCHARGE:                                 OPERATIVE REPORT   PHYSICIAN:  Balinda Quails, M.D.   ASSISTANT:  Salvadore Farber, M.D.   PROCEDURES:  1. Arch aortogram.  2. Bilateral selective carotid arteriograms.  3. Right subclavian arteriogram.   ACCESS:  Right common femoral artery, 5 French sheath.   CONTRAST:  130 mL Visipaque.   COMPLICATIONS:  None apparent.   CLINICAL NOTE:  [name] is an 77 year old female followed on a  regular basis for known asymptomatic carotid artery disease.  Most recent  evaluation revealed progression of velocities in the internal carotid artery  bilaterally consistent with high-grade stenoses.  She was brought to the  catheterization lab at this time for diagnostic workup and possible further  planning of surgical intervention.   PROCEDURE NOTE:  Patient brought to the catheterization lab in stable  condition.  Informed consent obtained.  Placed in the supine position.  Both  groins prepped and draped in a sterile fashion.   Skin and subcutaneous tissue on the right groin instilled with 1% Xylocaine.  A needle was used to introduce into the right common femoral artery.  A  0.035 Wholey guidewire advanced through the needle into the abdominal aorta.  The needle removed, a 5 French sheath advanced over the guidewire, the  dilator removed, and the sheath flushed with heparin and saline solution.   The guidewire was then advanced to the aortic arch and a standard pigtail  catheter advanced over the guidewire and positioned in the ascending aorta.  A 40 degree projection LAO arch aortogram obtained.  This  revealed a widely  patent innominate, left common carotid, and proximal left subclavian artery  origins.  There were distinct origins to each of these vessels off the  aortic arch.   The innominate artery bifurcated into a tortuous proximal, widely patent  right common carotid artery.  The proximal right subclavian artery revealed  multiple areas of stenosis and irregularity estimated to be 50-70%.  There  was a large right vertebral artery with antegrade flow.  The proximal  vertebral artery revealed approximately a 50% stenosis.  The proximal left  common carotid artery was widely patent.  The left vertebral artery revealed  a nonocclusive plaque at its origin.  There was irregular plaque in the  midportion of the left subclavian artery and no obvious left vertebral  artery was present.   The pigtail catheter was then exchanged for a V-Tech catheter.  The V-Tech  catheter was easily engaged into  the innominate artery.  An innominate  arteriogram obtained.  This revealed widely patent proximal right common  carotid and proximal right subclavian arteries.  The guidewire was then  advanced into the right common carotid artery.  Cervical and intracranial  views were obtained of the right carotid system.  Cervical right carotid  arteriography revealed a widely patent common carotid artery.  There was  tortuosity of the proximal right internal carotid artery.  Approximately a  70% stenosis was present with calcified plaque.  The right external carotid  artery was widely patent.  The remainder of the right internal carotid  artery was patent to the base of the skull.   Intracranial views were obtained, and these are dictated under separate  heading the neuroradiology department.   The V-Tech catheter was then drawn back into the innominate artery, the  guidewire advanced into the right subclavian artery, and the catheter  advanced into the proximal right subclavian artery.  Right  subclavian  injections were made to delineate the origin of the large right vertebral  artery. This revealed irregular 50% stenosis.  The right internal mammary  artery was noted to be widely patent.   The V-Tech catheter then drawn back into the aortic arch, engaged into the  left common carotid artery.  Left carotid arteriography revealed a widely  patent common carotid artery.  The carotid bifurcation revealed moderate  calcified plaque.  There was approximately a 50% origin stenosis of the left  internal carotid artery and external carotid arteries.  The remainder of the  left internal carotid artery was widely patent to the base of the skull.  Intracranial views were obtained in the AP and lateral projection, dictated  under a separate heading by neuroradiology.   The V-Tech catheter then disengaged from the left common carotid artery.  The guidewire reinserted and the catheter and guidewire were removed.  Right  femoral sheath removed.  No apparent complications.  Total contrast 130 mL  Visipaque.   FINAL IMPRESSION:  1. Occluded left vertebral artery.  2. Large right vertebral artery with antegrade flow and proximal 50%     irregular stenosis.  3. Moderately severe distal right subclavian disease with 50-70% stenoses     beyond the origin of the right vertebral artery origin.  4. Approximately 70% right internal carotid artery origin stenosis.  5. Approximately 50% left internal carotid artery stenosis.   DISPOSITION:  These results have been reviewed with the patient and family.  Arteriography has failed to reveal disease as severe as indicated by the  Doppler test.  At this time, due to the asymptomatic nature of this disease,  it is recommended that the patient continue maximal medical therapy with  Plavix and aspirin.  Follow-up will be arranged for six months in the  office.                                              Balinda Quails, M.D.    PGH/MEDQ  D:   03/07/2004  T:  03/07/2004  Job:  [id number]   cc:   Olene Craven, M.D.  [address]  [address]  [place]  [place] [address]  Fax: [phone]   Richard A. Alanda Amass, M.D.  [phone]  [place]  [place] [address]  Fax: [phone]   [facility]

## 2011-01-11 NOTE — Cardiovascular Report (Signed)
[facility]  Patient:    [name], [name]                       MRN: [id number] Proc. Date: 11/28/00 Adm. Date:  [id number] Attending:  Ruta Hinds CC:         CP Lab  Gwenith Daily Tyrone Sage, M.D.  Juluis Mire, M.D.  Madaline Savage, M.D.   Cardiac Catheterization  PROCEDURE:  Retrograde central aortic catheterization, selective coronary angiography by Judkins technique, LV angiogram RAO, LAO projection, subselective RIMA, LIMA with left subclavian, right brachial cephalic injections, abdominal aortic angiogram mid stream PA projection, aspirin and Plavix 150, weight adjusted heparin, culprit lesion PTCA high grade stenosis hypoplastic SVG-RCA secondary to distal insertion stenosis treated with PTCA.  BRIEF HISTORY:  [name] is a erythema pleasant 77 year old white widowed mother of three living children (two deceased), 52 grandchildren and 17 great grandchildren. She has a history of coronary artery disease and underwent catheterization for unstable angina May 26, 1996 showing high-grade distal left main coronary stenosis and three vessel coronary artery disease. At that time, she had total occlusion of her mid right coronary with collaterals 70-80% distal main left, 70% ostial LAD and 90% proximal circumflex stenosis.  The patient underwent multivessel CABG by Dr. Tyrone Sage on June 01, 1996 with LIMA to the LAD, SVG to the OM and SVG to the PDA. The patient did quite well without recurrent anginal symptoms on limited medication and she had not been back for cardiology follow-up since her initial postoperative course. She has known bilateral carotid disease that has been asymptomatic and she has declined further evaluation of this and/or surgery remotely when seen by Dr. Madilyn Fireman. She also has known bilateral subclavian artery stenosis without significant upper extremity claudication and no vertebral steal symptoms. She does have  significant GERD and has been seen by Dr. Melvia Heaps in the past and has a known large hiatal hernia.  She was hypothyroid on supplement and was on no other regular medications. She had not been taking aspirin because of prior history of epistaxis.  Lipid status is unknown.  She was admitted with prolonged chest pain compatible with ischemia without myocardial infarction on serial enzymes and EKGs. She had mild anemia. There was mild pulse deficit in the upper extremities and good femoral pulses so it was felt that her upper extremity blood pressures were probably not accurate because of her known bilateral subclavian stenosis.  DESCRIPTION OF PROCEDURE:  The patient was brought to the second floor CP lab in the post absorptive state after 5 mg Valium p.o. premedication. She had been given 150 of Plavix the night before the procedure and was started on aspirin. Low dose beta blockers were begun and appropriate lipid profile is pending.  Informed consent was obtained to proceed. The right groin was prepped and draped in the usual manner and 1% Xylocaine was used for local anesthesia. The RFA was entered with a single anterior puncture using an 18 thin-walled needled and a 6 French short side-arm sheath was inserted without difficulty. Diagnostic coronary angiography was done with a 6 French 4 cm tapered Cordis preformed coronary catheters. Saphenous vein graft angiography was done with a right coronary catheter for the SVG to the OM and a multipurpose B2 catheter for the SVG to the RCA which had an inferior straight takeoff. Subselective LIMA was done with the multipurpose catheter. This was adequate for visualization because of the subclavian stenosis beyond  the LIMA origin. I did not want to do selective LMA injection because of the calcific disease of the left subclavian artery angiographically.  LV angiogram was done in the RAO and LAO projection, 25 cc, 14 cc per second and  20 cc, 12 cc per second for a pigtail 6 French catheter. Pullback pressure in the CA showed no gradient across the aortic valve. Her cuff pressure was approximately 40 mm less from the upper extremities than her central aortic pressures.  PRESSURES: 1. LV:  167/0 post IC nitroglycerin administration prior to this 180-190 mmHg.    LVADP 18 mmHg. 2. CA:  167/74 mmHg. There was no gradient across the aortic valve on catheter    pullback.  LV ANGIOGRAM:  Abdominal aortic angiogram was done in the midstream PA projection. This demonstrated infrarenal atherosclerotic disease of moderately severe degree but no aneurysm formation. There was mild stenosis of about 30% below the single renal arteries bilaterally. Selective right renal angiogram was done demonstrating a high grade right renal ostial stenosis. The left renal artery had no significant stenosis.  The proximal iliacs were widely patent with good runoff with moderate irregularities.  The left subclavian artery had 40-50% narrowing in the mid portion of the proximal third before the patent vertebra which had antegrade flow and good antegrade flow to the LIMA. There was a large thoracic branch near the proximal LIMA. The subclavian artery post IMA showed a 90% stenosis or greater just after the LIMA with tortuosity beyond this and a tandem 50% lesion.  Right brachiocephalic injection demonstrated a lumpy bumpy area of diffuse disease beyond the antegrade flow of the right vertebral and there was an 85% right subclavian artery stenosis in its mid portion. There was also irregularity of the distal subclavian proximal axillary area but there was antegrade flow.  ______ with the small screen visualization, there appeared to be 85% stenosis of the proximal right internal carotid artery.   The left internal carotid artery likewise showed high grade 85-90% calcific stenosis in a single PA view.  LV ANGIOGRAM:  The patient  tolerated the diagnostic procedure well with no chest pain or arrhythmia.  LV angiogram showed hypokinesis of the mid anterolateral wall and mid inferior wall and septal apical area. EF, however, was approximately 50-55% with +1/+4 mitral regurgitation and relatively well preserved systolic function.  NATIVE CORONARY ANGIOGRAPHY:  The distal left main had 80% narrowing. There appeared to be another 70-80% narrowing of the proximal and ostial LAD beyond the optional diagonal and circumflex.  The LAD was seen to fill antegrade and there was retrograde filling of the LIMA in a competetive fashion. The distal LAD could be seen on powerful hand injection down through the apex of the heart where it was irregular and there was about 60% narrowing beyond the LIMA but good flow.  The optional diagonal at 60-70% segmental narrowing in the mid portion.  The circumflex artery had bidirectional flow and retrograde flow to the vein graft in the mid circumflex. A small first marginal was seen and on powerful hand injection the distal circumflex was visualized.  There were collaterals from the distal circumflex from the graft to the PTA.  The right coronary artery had an ostial 95% stenosis and the mid portion was patent with 85% segmental narrowing. This appeared to be a true lumen rather than recannulization. The SVG to the right was not seen on retrograde filling and there was diminished flow to the PLA and PDA.  The LIMA  was widely patent with excellent anastomosis to the mid LAD. There was a 60% lesion beyond the graft but there was good flow and as mentioned competitive from the native circulation.  The saphenous vein graft to the mid circumflex was widely patent and filled two distal marginal branches that were moderately large extremely well with good antegrade filling. There were collaterals to the PVA.  The saphenous vein graft to the RCA was hypoplastic beyond the origin. There was a  95% stenosis just proximal to the insertion site into the PVA and slow flow to the PVA even after IC nitroglycerin administration.  DISCUSSION:  Of note, there was significant air in the patients esophagus compatible with a very large hiatal hernia, no air fluid level was seen.  The patient has unstable angina or culprit lesion probably her saphenous vein graft insertional stenosis into a very small PVA. The graft is hypoplastic but does not appear angiographically diffusely diseased and it may open up if we opened the insertion site up. I would prefer not to approach her native RCA since there is a high grade 95% ostial stenosis that was difficult to cannulate in the small vessel and long segmental disease throughout the mid portion that had been occluded on previous angiograms.  The patient had a Foley put in and initially was clear and no hematuria was present. She had been on heparin up until coming to the ______. It was best in this incidence not to use a IIb/IIIa inhibitor. She was, however, given 150 of Plavix in the lab.  The right coronary artery was intubated with a JR4 6 [place] guiding catheter and an HTX 0.014 inch guidewire was used to traverse the graft, stabilize the guiding catheter. With the aid of a 1.5/15 SciMed Maverick balloon, we were able to cross the high-grade stenosis at the insertion site and position the wire in the PVA distally. The balloon crossed the lesion and stenosis was dilated at 10-49 and 11-44. The balloon was then upgraded 10 a 2.0/15 ACS CrossSail balloon and inflations were done at 10-42 and 12-42. The balloon was further upgraded to a 2.5/15 ACS CrossSail rapid exchange balloon and the lesion was now at 6-51 and 7-51 and there was an excellent angiographic result related to this and prompt flow to the PVA which was irrelevant to the small vessel but appeared much bigger than at the start of the procedure. The graft still appeared relatively  hypoplastic but had dilated up somewhat. There were no other significant graft lesions noted.  The patient has had successful culprit lesion PTCA alone of the SVG insertion to the distal RCA. We will recommend medical therapy now for coronary disease. She should be on chronic aspirin and Plavix as tolerated although she has a large hiatal hernia evidenced on fluoroscopy and by history that is symptomatic. GI consultation will be obtained.  The patient also has significant peripheral vascular disease and bilateral subclavian stenosis beyond the vertebrals with no vertebral steal symptoms, moderate stenosis to the left subclavian proximal to the left vertebral and no upper extremity claudication.  She does, however, have asymptomatic high-grade bilateral carotid artery stenosis and we will ask Dr. Liliane Bade to review this for [place] with the patient and family postoperatively.  From a cardiac standpoint, she would be a candidate for carotid endarterectomy if felt necessary.  CATHETERIZATION DIAGNOSES:  1. Arteriosclerotic heart disease--unstable angina with four vessel coronary     disease on CAT, October 1997 and subsequent coronary artery bypass  graft     x 3, June 01, 1996 by Dr. Tyrone Sage.  2. Recurrent angina.  3. Successful PTCA highgrade culprit lesion stenosis, SVG insertion to distal     right coronary artery, November 28, 2000.  4. Well preserved left ventricular function, +1 mitral regurgitation.  5. Patent left internal mammary artery to left anterior descending; patent     saphenous vein graft to CXOM.  6. An 80-90% right renal artery stenosis to be evaluated later. Normal renal     function.  7. Bilateral subclavian stenosis with unreliable upper extremity cuff     pressures and systemic central hypertension as outlined.  8. Probable hyperlipidemia.  9. Gastroesophageal reflux disease, large hiatal hernia. 10. Mild anemia. 11. Bilateral asymptomatic carotid stenosis. DD:   11/28/00 TD:  11/28/00 Job: [id number]

## 2011-01-16 NOTE — Procedures (Unsigned)
CAROTID DUPLEX EXAM  INDICATION:  Followup carotid stenosis.  HISTORY: Diabetes:  No. Cardiac:  No. Hypertension:  Yes. Smoking:  No. Previous Surgery:  Right CEA on 10/17/2008. CV History:  Asymptomatic. Amaurosis Fugax No, Paresthesias No, Hemiparesis No                                      RIGHT             LEFT Brachial systolic pressure:         126               106 Brachial Doppler waveforms:         WNL               Biphasic Vertebral direction of flow:        Antegrade         Retrograde DUPLEX VELOCITIES (cm/sec) CCA peak systolic                   126               63 ECA peak systolic                   72                418 ICA peak systolic                   62                270 ICA end diastolic                   13                72 PLAQUE MORPHOLOGY:                  Heterogeneous     Heterogeneous PLAQUE AMOUNT:                      Mild              Moderate to severe PLAQUE LOCATION:                    CCA               CCA, ICA, ECA  IMPRESSION: 1. Bilateral common carotid artery disease present. 2. Right internal carotid artery patent with history of     endarterectomy. 3. Left internal carotid artery stenosis in the 60% to 79% range. 4. Left external carotid artery stenosis present. 5. Abnormal retrograde flow noted in the vicinity of the left     vertebral artery with history of known vertebral artery occlusion. 6. Right subclavian stenosis in the 50% to 75% range with a peak     systolic velocity of 337 cm/s. 7. Left subclavian artery abnormal blunted monophasic flow present     suggestive of a more proximal stenosis. 8. Essentially unchanged from previous studies.       ___________________________________________ Fransisco Hertz, MD  SH/MEDQ  D:  01/11/2011  T:  01/11/2011  Job:  [id number]

## 2011-03-07 IMAGING — CR DG CHEST 2V
2 series · 2 of 2 positions shown · non-contrast
Comparison: 10/13/2008

CLINICAL DATA: Cough

CHEST - 2 VIEW

[w chest pa]
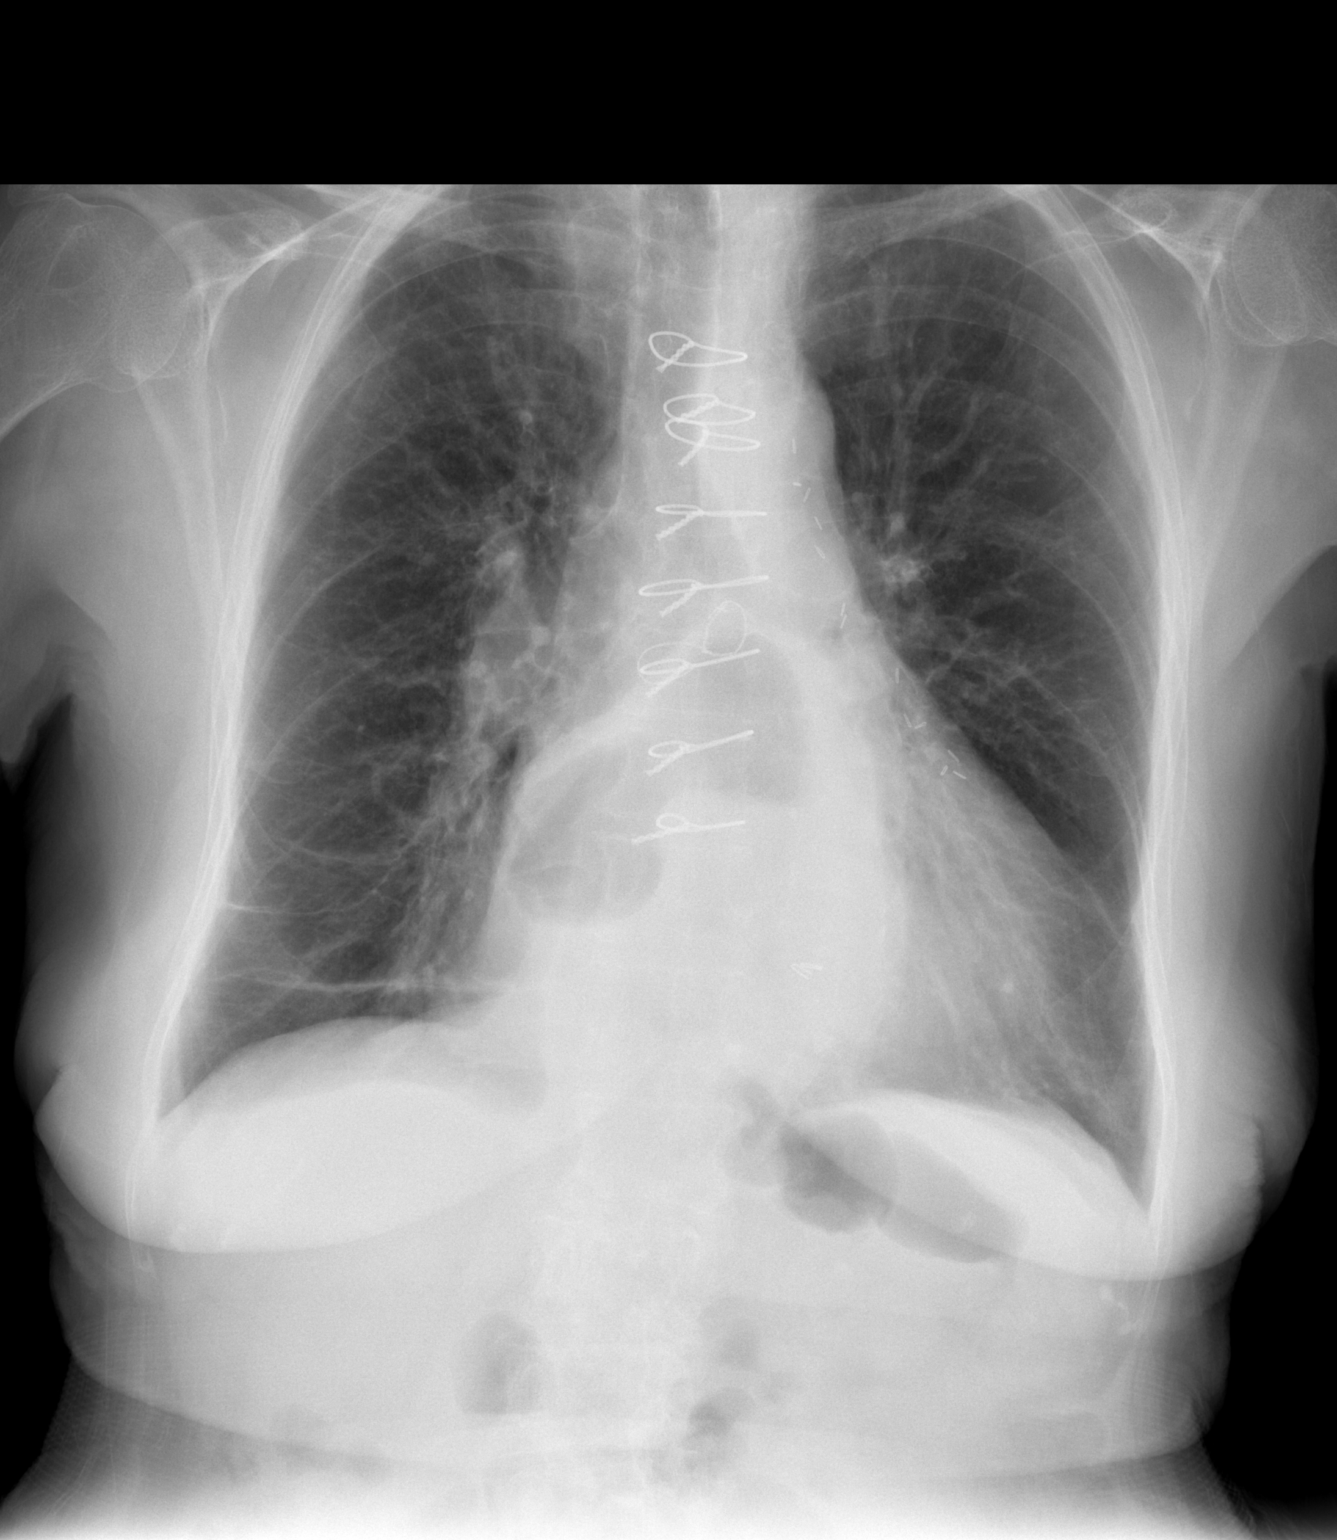

[w chest lat]
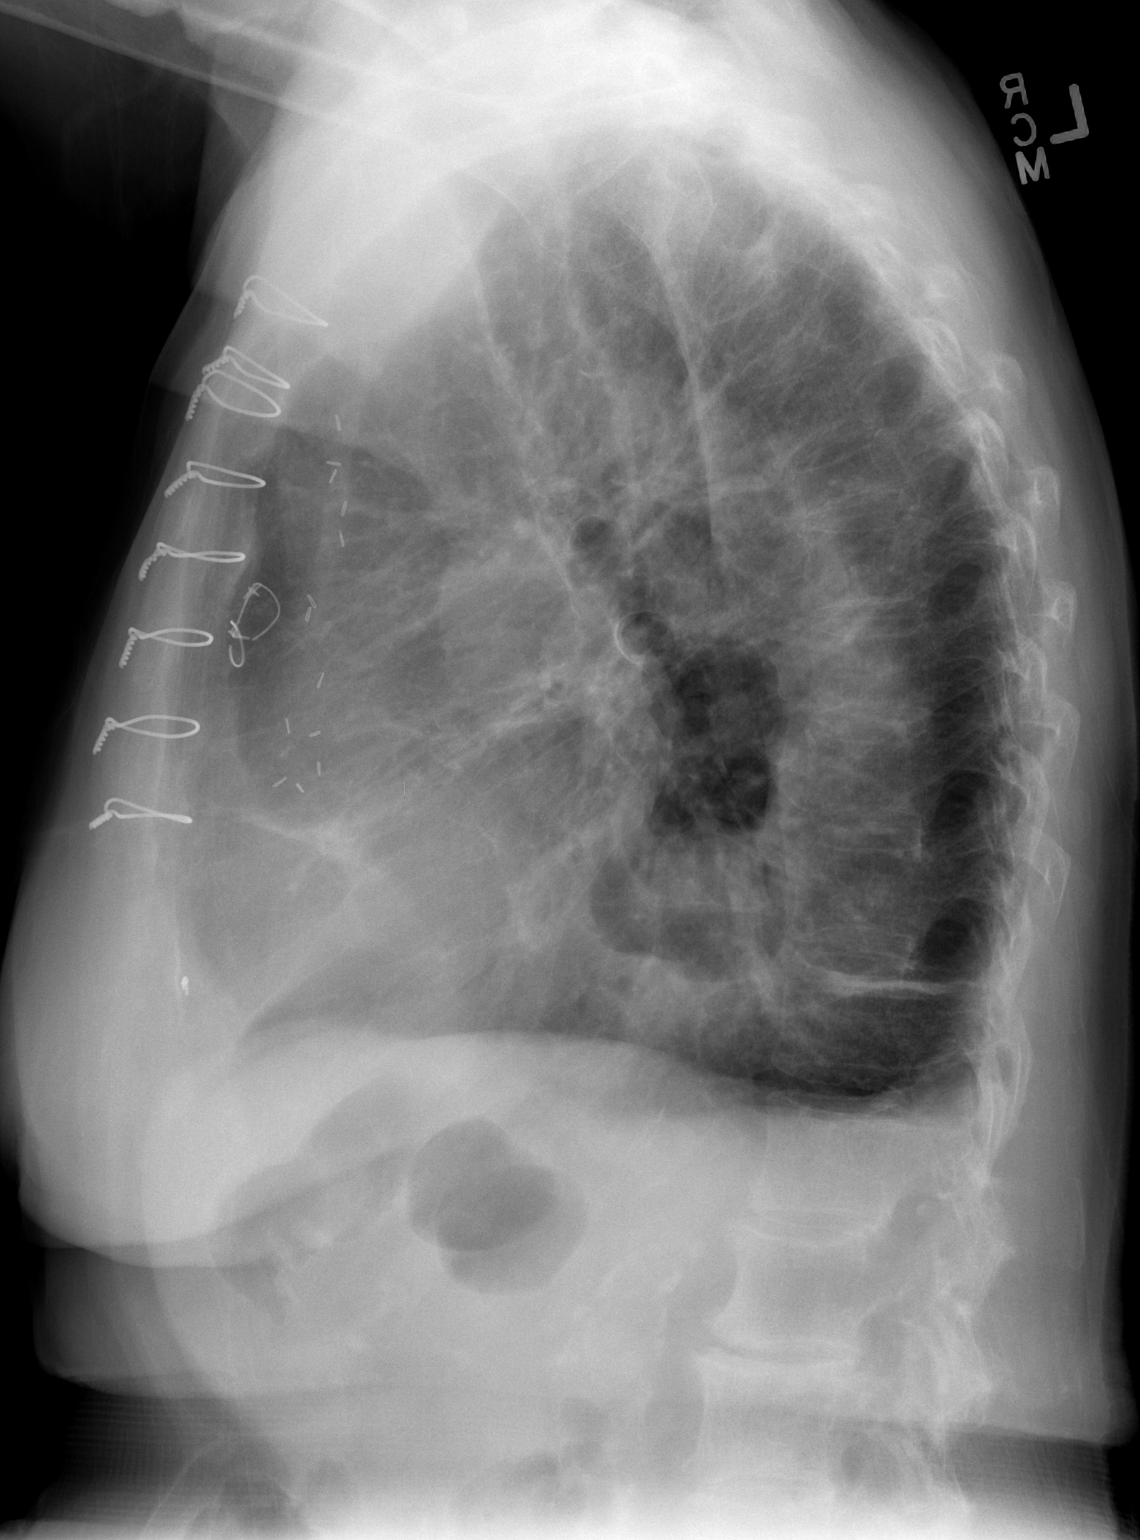

[2 of 2 positions shown; findings below may reference images not displayed]

FINDINGS: Cardiomegaly again noted.  The patient is status post
median sternotomy.  Right basilar scarring is stable.  Mild
hyperinflation again noted.  Large hiatal hernia again noted.  No
acute infiltrate or edema.
IMPRESSION: No active disease.  Cardiomegaly again noted.  Stable right basilar
scarring.  Large hiatal hernia again noted.

## 2011-03-28 ENCOUNTER — Emergency Department (HOSPITAL_COMMUNITY)
Admission: EM | Admit: 2011-03-28 | Discharge: 2011-03-29 | Disposition: A | Discharge status: Home / Self Care | Payer: No Typology Code available for payment source | Attending: Emergency Medicine | Admitting: Emergency Medicine

## 2011-03-28 ENCOUNTER — Emergency Department (HOSPITAL_COMMUNITY): Payer: No Typology Code available for payment source

## 2011-03-28 DIAGNOSIS — E039 Hypothyroidism, unspecified: Secondary | ICD-10-CM | POA: Insufficient documentation

## 2011-03-28 DIAGNOSIS — I10 Essential (primary) hypertension: Secondary | ICD-10-CM | POA: Insufficient documentation

## 2011-03-28 DIAGNOSIS — G319 Degenerative disease of nervous system, unspecified: Secondary | ICD-10-CM | POA: Insufficient documentation

## 2011-03-28 DIAGNOSIS — I491 Atrial premature depolarization: Secondary | ICD-10-CM | POA: Insufficient documentation

## 2011-03-28 DIAGNOSIS — N390 Urinary tract infection, site not specified: Secondary | ICD-10-CM | POA: Insufficient documentation

## 2011-03-28 DIAGNOSIS — R42 Dizziness and giddiness: Secondary | ICD-10-CM | POA: Insufficient documentation

## 2011-03-28 DIAGNOSIS — R110 Nausea: Secondary | ICD-10-CM | POA: Insufficient documentation

## 2011-03-28 LAB — DIFFERENTIAL
Basophils Absolute: 0 10*3/uL (ref 0.0–0.1)
Lymphocytes Relative: 13 % (ref 12–46)
Lymphs Abs: 1.3 10*3/uL (ref 0.7–4.0)
Monocytes Absolute: 0.6 10*3/uL (ref 0.1–1.0)
Neutro Abs: 8 10*3/uL — ABNORMAL HIGH (ref 1.7–7.7)

## 2011-03-28 LAB — BASIC METABOLIC PANEL
BUN: 19 mg/dL (ref 6–23)
Chloride: 101 mEq/L (ref 96–112)
GFR calc non Af Amer: 57 mL/min — ABNORMAL LOW (ref >60)
Glucose, Bld: 153 mg/dL — ABNORMAL HIGH (ref 70–99)
Potassium: 3.8 mEq/L (ref 3.5–5.1)
Sodium: 137 mEq/L (ref 135–145)

## 2011-03-28 LAB — TROPONIN I: Troponin I: <0.3 ng/mL (ref <0.30)

## 2011-03-28 LAB — URINALYSIS, ROUTINE W REFLEX MICROSCOPIC
Bilirubin Urine: NEGATIVE
Glucose, UA: NEGATIVE mg/dL
Hgb urine dipstick: NEGATIVE
Ketones, ur: NEGATIVE mg/dL
Protein, ur: NEGATIVE mg/dL
Urobilinogen, UA: 0.2 mg/dL (ref 0.0–1.0)

## 2011-03-28 LAB — CBC
HCT: 39.3 % (ref 36.0–46.0)
Hemoglobin: 13.6 g/dL (ref 12.0–15.0)
MCHC: 34.6 g/dL (ref 30.0–36.0)
MCV: 92.7 fL (ref 78.0–100.0)
WBC: 10 10*3/uL (ref 4.0–10.5)

## 2011-03-28 LAB — URINE MICROSCOPIC-ADD ON

## 2011-03-30 LAB — URINE CULTURE: Culture  Setup Time: 201208022208

## 2011-05-24 LAB — BASIC METABOLIC PANEL
BUN: 24 — ABNORMAL HIGH
BUN: 24 — ABNORMAL HIGH
CO2: 26
CO2: 27
Calcium: 8.5
Calcium: 8.8
Calcium: 9.6
Chloride: 100
Creatinine, Ser: 1.11
Creatinine, Ser: 1.23 — ABNORMAL HIGH
GFR calc Af Amer: 49 — ABNORMAL LOW
GFR calc Af Amer: 50 — ABNORMAL LOW
GFR calc Af Amer: 56 — ABNORMAL LOW
Glucose, Bld: 124 — ABNORMAL HIGH
Sodium: 136

## 2011-05-24 LAB — LIPID PANEL
HDL: 45
LDL Cholesterol: 142 — ABNORMAL HIGH
Total CHOL/HDL Ratio: 4.7
Triglycerides: 116
VLDL: 23

## 2011-05-24 LAB — DIFFERENTIAL
Eosinophils Relative: 4
Lymphocytes Relative: 16
Lymphs Abs: 0.9
Monocytes Absolute: 0.3
Monocytes Relative: 5

## 2011-05-24 LAB — POCT CARDIAC MARKERS
CKMB, poc: 2.1
Myoglobin, poc: 99.7
Operator id: 234501
Troponin i, poc: <0.05

## 2011-05-24 LAB — CBC
HCT: 35.3 — ABNORMAL LOW
Hemoglobin: 12
MCHC: 34.3
MCV: 92.9
Platelets: 134 — ABNORMAL LOW
Platelets: 178
RBC: 3.47 — ABNORMAL LOW
RBC: 4.13
RDW: 14.4
WBC: 5.9
WBC: 7.8

## 2011-05-24 LAB — POCT I-STAT, CHEM 8
BUN: 21
Calcium, Ion: 1.12
Creatinine, Ser: 1.4 — ABNORMAL HIGH
TCO2: 24

## 2011-05-24 LAB — B-NATRIURETIC PEPTIDE (CONVERTED LAB)
Pro B Natriuretic peptide (BNP): 190 — ABNORMAL HIGH
Pro B Natriuretic peptide (BNP): 435 — ABNORMAL HIGH

## 2011-05-24 LAB — CARDIAC PANEL(CRET KIN+CKTOT+MB+TROPI)
Relative Index: INVALID
Relative Index: INVALID
Total CK: 77
Troponin I: 0.1 — ABNORMAL HIGH
Troponin I: 0.14 — ABNORMAL HIGH

## 2011-05-24 LAB — CK TOTAL AND CKMB (NOT AT ARMC): CK, MB: 3.1

## 2011-05-24 LAB — TSH: TSH: 5.96 — ABNORMAL HIGH

## 2012-08-12 ENCOUNTER — Encounter: Payer: Self-pay | Admitting: Vascular Surgery

## 2012-09-18 ENCOUNTER — Other Ambulatory Visit: Payer: Self-pay | Admitting: *Deleted

## 2012-09-18 DIAGNOSIS — I6529 Occlusion and stenosis of unspecified carotid artery: Secondary | ICD-10-CM

## 2012-09-18 DIAGNOSIS — Z48812 Encounter for surgical aftercare following surgery on the circulatory system: Secondary | ICD-10-CM

## 2012-09-22 ENCOUNTER — Encounter: Payer: Self-pay | Admitting: Neurosurgery

## 2012-09-30 ENCOUNTER — Encounter: Payer: Self-pay | Admitting: Neurosurgery

## 2012-10-01 ENCOUNTER — Ambulatory Visit (INDEPENDENT_AMBULATORY_CARE_PROVIDER_SITE_OTHER): Payer: No Typology Code available for payment source | Admitting: Neurosurgery

## 2012-10-01 ENCOUNTER — Encounter: Payer: Self-pay | Admitting: Neurosurgery

## 2012-10-01 ENCOUNTER — Other Ambulatory Visit (INDEPENDENT_AMBULATORY_CARE_PROVIDER_SITE_OTHER): Payer: No Typology Code available for payment source | Admitting: Vascular Surgery

## 2012-10-01 VITALS — BP 120/82 | HR 70 | Resp 16 | Ht 59.0 in | Wt 101.0 lb

## 2012-10-01 DIAGNOSIS — Z48812 Encounter for surgical aftercare following surgery on the circulatory system: Secondary | ICD-10-CM

## 2012-10-01 DIAGNOSIS — I6529 Occlusion and stenosis of unspecified carotid artery: Secondary | ICD-10-CM

## 2012-10-01 NOTE — Progress Notes (Signed)
[facility] Carotid Office Note  CC: Carotid surveillance Referring Physician: Imogene Burn  History of Present Illness: 79 year old female patient of Dr. Imogene Burn status post right CEA in 2010. The patient denies any signs or symptoms of CVA, TIA, amaurosis fugax. The patient denies any new medical diagnoses or recent surgery.  Past Medical History  Diagnosis Date  . Carotid artery occlusion   . Hypertension   . CHF (congestive heart failure)   . Thyroid disease     Hypothyroidism  . GERD (gastroesophageal reflux disease)   . Hiatal hernia   . Renal artery stenosis   . CKD (chronic kidney disease)     ROS: [x]  Positive   [ ]  Denies    General: [ ]  Weight loss, [ ]  Fever, [ ]  chills Neurologic: [ ]  Dizziness, [ ]  Blackouts, [ ]  Seizure [ ]  Stroke, [ ]  "Mini stroke", [ ]  Slurred speech, [ ]  Temporary blindness; [ ]  weakness in arms or legs, [ ]  Hoarseness Cardiac: [ ]  Chest pain/pressure, [ ]  Shortness of breath at rest [ ]  Shortness of breath with exertion, [ ]  Atrial fibrillation or irregular heartbeat Vascular: [ ]  Pain in legs with walking, [ ]  Pain in legs at rest, [ ]  Pain in legs at night,  [ ]  Non-healing ulcer, [ ]  Blood clot in vein/DVT,   Pulmonary: [ ]  Home oxygen, [ ]  Productive cough, [ ]  Coughing up blood, [ ]  Asthma,  [ ]  Wheezing Musculoskeletal:  [ ]  Arthritis, [ ]  Low back pain, [ ]  Joint pain Hematologic: [ ]  Easy Bruising, [ ]  Anemia; [ ]  Hepatitis Gastrointestinal: [ ]  Blood in stool, [ ]  Gastroesophageal Reflux/heartburn, [ ]  Trouble swallowing Urinary: [ ]  chronic Kidney disease, [ ]  on HD - [ ]  MWF or [ ]  TTHS, [ ]  Burning with urination, [ ]  Difficulty urinating Skin: [ ]  Rashes, [ ]  Wounds Psychological: [ ]  Anxiety, [ ]  Depression   Social History History  Substance Use Topics  . Smoking status: Never Smoker   . Smokeless tobacco: Never Used  . Alcohol Use: No    Family History Family History  Problem Relation Age of Onset   . Family history unknown: Yes    Allergies  Allergen Reactions  . Codeine     Hallucination   . Morphine And Related     Hallucination  . Lasix (Furosemide) Nausea Only    Cramps    Current Outpatient Prescriptions  Medication Sig Dispense Refill  . amLODipine (NORVASC) 5 MG tablet Take 5 mg by mouth daily.      Marland Kitchen aspirin 81 MG tablet Take 81 mg by mouth daily.      Marland Kitchen levothyroxine (SYNTHROID, LEVOTHROID) 50 MCG tablet Take 50 mcg by mouth daily.        Physical Examination  Filed Vitals:   10/01/12 1030  BP: 120/82  Pulse: 70  Resp:     Body mass index is 20.40 kg/(m^2).  General:  WDWN in NAD Gait: Normal HEENT: WNL Eyes: Pupils equal Pulmonary: normal non-labored breathing , without Rales, rhonchi,  wheezing Cardiac: RRR, without  Murmurs, rubs or gallops; Abdomen: soft, NT, no masses Skin: no rashes, ulcers noted  Vascular Exam Pulses: 2+ radial pulses bilaterally Carotid bruits: Carotid pulses heard on the right with a mild bruit heard on the left Extremities without ischemic changes, no Gangrene , no cellulitis; no open wounds;  Musculoskeletal: no muscle wasting or atrophy   Neurologic: A&O X 3;  Appropriate Affect ; SENSATION: normal; MOTOR FUNCTION:  moving all extremities equally. Speech is fluent/normal  Non-Invasive Vascular Imaging CAROTID DUPLEX 10/01/2012  Right ICA 0 - 19% stenosis Left ICA 60 - 79 % stenosis   ASSESSMENT/PLAN: Asymptomatic patient with unchanged carotid duplex from 2012. I explained to the patient that she would need to followup in 6 months with repeat carotid duplex. The patient states she's not sure she would walk any further intervention but will come per the duplex. The patient's were encouraged and answered, she is in agreement with this plan.   Lauree Chandler ANP   Clinic MD: Darrick Penna

## 2012-10-02 NOTE — Addendum Note (Signed)
Addended by: Sharee Pimple on: 10/02/2012 01:40 PM   Modules accepted: Orders

## 2013-04-08 ENCOUNTER — Other Ambulatory Visit: Payer: No Typology Code available for payment source

## 2013-04-08 ENCOUNTER — Ambulatory Visit: Payer: No Typology Code available for payment source | Admitting: Neurosurgery

## 2013-09-29 ENCOUNTER — Encounter (HOSPITAL_COMMUNITY): Payer: Self-pay | Admitting: Emergency Medicine

## 2013-09-29 ENCOUNTER — Emergency Department (HOSPITAL_COMMUNITY): Payer: Medicare Other

## 2013-09-29 ENCOUNTER — Observation Stay (HOSPITAL_COMMUNITY)
Admission: EM | Admit: 2013-09-29 | Discharge: 2013-09-30 | Disposition: A | Discharge status: Home / Self Care | Payer: Medicare Other | Attending: Internal Medicine | Admitting: Internal Medicine

## 2013-09-29 DIAGNOSIS — N179 Acute kidney failure, unspecified: Secondary | ICD-10-CM | POA: Diagnosis present

## 2013-09-29 DIAGNOSIS — R5383 Other fatigue: Principal | ICD-10-CM

## 2013-09-29 DIAGNOSIS — I129 Hypertensive chronic kidney disease with stage 1 through stage 4 chronic kidney disease, or unspecified chronic kidney disease: Secondary | ICD-10-CM | POA: Insufficient documentation

## 2013-09-29 DIAGNOSIS — I10 Essential (primary) hypertension: Secondary | ICD-10-CM | POA: Diagnosis present

## 2013-09-29 DIAGNOSIS — Z7982 Long term (current) use of aspirin: Secondary | ICD-10-CM | POA: Insufficient documentation

## 2013-09-29 DIAGNOSIS — R5381 Other malaise: Principal | ICD-10-CM | POA: Insufficient documentation

## 2013-09-29 DIAGNOSIS — N189 Chronic kidney disease, unspecified: Secondary | ICD-10-CM | POA: Insufficient documentation

## 2013-09-29 DIAGNOSIS — Z23 Encounter for immunization: Secondary | ICD-10-CM | POA: Insufficient documentation

## 2013-09-29 DIAGNOSIS — E039 Hypothyroidism, unspecified: Secondary | ICD-10-CM | POA: Diagnosis present

## 2013-09-29 DIAGNOSIS — I2789 Other specified pulmonary heart diseases: Secondary | ICD-10-CM | POA: Insufficient documentation

## 2013-09-29 DIAGNOSIS — F0390 Unspecified dementia without behavioral disturbance: Secondary | ICD-10-CM | POA: Diagnosis present

## 2013-09-29 DIAGNOSIS — I509 Heart failure, unspecified: Secondary | ICD-10-CM | POA: Insufficient documentation

## 2013-09-29 DIAGNOSIS — I6529 Occlusion and stenosis of unspecified carotid artery: Secondary | ICD-10-CM | POA: Insufficient documentation

## 2013-09-29 DIAGNOSIS — R110 Nausea: Secondary | ICD-10-CM | POA: Insufficient documentation

## 2013-09-29 DIAGNOSIS — R531 Weakness: Secondary | ICD-10-CM | POA: Diagnosis present

## 2013-09-29 DIAGNOSIS — K449 Diaphragmatic hernia without obstruction or gangrene: Secondary | ICD-10-CM | POA: Insufficient documentation

## 2013-09-29 DIAGNOSIS — I701 Atherosclerosis of renal artery: Secondary | ICD-10-CM | POA: Insufficient documentation

## 2013-09-29 DIAGNOSIS — K219 Gastro-esophageal reflux disease without esophagitis: Secondary | ICD-10-CM | POA: Insufficient documentation

## 2013-09-29 LAB — URINALYSIS, ROUTINE W REFLEX MICROSCOPIC
Bilirubin Urine: NEGATIVE
Glucose, UA: NEGATIVE mg/dL
Hgb urine dipstick: NEGATIVE
Ketones, ur: 15 mg/dL — AB
Leukocytes, UA: NEGATIVE
Nitrite: NEGATIVE
PROTEIN: NEGATIVE mg/dL
Specific Gravity, Urine: 1.014 (ref 1.005–1.030)
UROBILINOGEN UA: 0.2 mg/dL (ref 0.0–1.0)
pH: 6 (ref 5.0–8.0)

## 2013-09-29 LAB — CBC WITH DIFFERENTIAL/PLATELET
BASOS PCT: 0 % (ref 0–1)
Basophils Absolute: 0 10*3/uL (ref 0.0–0.1)
Eosinophils Absolute: 0.1 10*3/uL (ref 0.0–0.7)
Eosinophils Relative: 1 % (ref 0–5)
HCT: 38.2 % (ref 36.0–46.0)
Hemoglobin: 13.2 g/dL (ref 12.0–15.0)
LYMPHS ABS: 0.6 10*3/uL — AB (ref 0.7–4.0)
Lymphocytes Relative: 8 % — ABNORMAL LOW (ref 12–46)
MCH: 34.1 pg — ABNORMAL HIGH (ref 26.0–34.0)
MCHC: 34.6 g/dL (ref 30.0–36.0)
MCV: 98.7 fL (ref 78.0–100.0)
Monocytes Absolute: 0.3 10*3/uL (ref 0.1–1.0)
Monocytes Relative: 4 % (ref 3–12)
NEUTROS PCT: 87 % — AB (ref 43–77)
Neutro Abs: 6.5 10*3/uL (ref 1.7–7.7)
PLATELETS: 172 10*3/uL (ref 150–400)
RBC: 3.87 MIL/uL (ref 3.87–5.11)
RDW: 14.1 % (ref 11.5–15.5)
WBC: 7.5 10*3/uL (ref 4.0–10.5)

## 2013-09-29 LAB — TROPONIN I

## 2013-09-29 LAB — BASIC METABOLIC PANEL
BUN: 24 mg/dL — ABNORMAL HIGH (ref 6–23)
CO2: 23 mEq/L (ref 19–32)
Calcium: 9.7 mg/dL (ref 8.4–10.5)
Chloride: 95 mEq/L — ABNORMAL LOW (ref 96–112)
Creatinine, Ser: 1.41 mg/dL — ABNORMAL HIGH (ref 0.50–1.10)
GFR calc non Af Amer: 31 mL/min — ABNORMAL LOW (ref >90)
GFR, EST AFRICAN AMERICAN: 36 mL/min — AB (ref >90)
Glucose, Bld: 147 mg/dL — ABNORMAL HIGH (ref 70–99)
POTASSIUM: 4.4 meq/L (ref 3.7–5.3)
SODIUM: 135 meq/L — AB (ref 137–147)

## 2013-09-29 LAB — PRO B NATRIURETIC PEPTIDE: Pro B Natriuretic peptide (BNP): 279.8 pg/mL (ref 0–450)

## 2013-09-29 MED ORDER — ALUM & MAG HYDROXIDE-SIMETH 200-200-20 MG/5ML PO SUSP: 30.0000 mL | Freq: Four times a day (QID) | ORAL | Status: DC | PRN

## 2013-09-29 MED ORDER — ACETAMINOPHEN 650 MG RE SUPP: 650.0000 mg | Freq: Four times a day (QID) | RECTAL | Status: DC | PRN

## 2013-09-29 MED ORDER — ASPIRIN 81 MG PO TABS
81.0000 mg | ORAL_TABLET | Freq: Every day | ORAL | Status: DC
  Administered 2013-09-29: 81 mg via ORAL
  Filled 2013-09-29 (×2): qty 1

## 2013-09-29 MED ORDER — ONDANSETRON HCL 4 MG/2ML IJ SOLN: 4.0000 mg | Freq: Four times a day (QID) | INTRAMUSCULAR | Status: DC | PRN

## 2013-09-29 MED ORDER — ONDANSETRON HCL 4 MG PO TABS: 4.0000 mg | ORAL_TABLET | Freq: Four times a day (QID) | ORAL | Status: DC | PRN

## 2013-09-29 MED ORDER — DONEPEZIL HCL 5 MG PO TABS
5.0000 mg | ORAL_TABLET | Freq: Every morning | ORAL | Status: DC
  Administered 2013-09-30: 5 mg via ORAL
  Filled 2013-09-29: qty 1

## 2013-09-29 MED ORDER — HEPARIN SODIUM (PORCINE) 5000 UNIT/ML IJ SOLN
5000.0000 [IU] | Freq: Three times a day (TID) | INTRAMUSCULAR | Status: DC
  Administered 2013-09-30: 5000 [IU] via SUBCUTANEOUS
  Filled 2013-09-29 (×5): qty 1

## 2013-09-29 MED ORDER — LEVOTHYROXINE SODIUM 75 MCG PO TABS
75.0000 ug | ORAL_TABLET | Freq: Every day | ORAL | Status: DC
  Administered 2013-09-30: 75 ug via ORAL
  Filled 2013-09-29 (×2): qty 1

## 2013-09-29 MED ORDER — AMLODIPINE BESYLATE 5 MG PO TABS
5.0000 mg | ORAL_TABLET | Freq: Every evening | ORAL | Status: DC
  Administered 2013-09-29: 5 mg via ORAL
  Filled 2013-09-29 (×2): qty 1

## 2013-09-29 MED ORDER — ATORVASTATIN CALCIUM 20 MG PO TABS
20.0000 mg | ORAL_TABLET | Freq: Every day | ORAL | Status: DC
  Filled 2013-09-29: qty 1

## 2013-09-29 MED ORDER — SODIUM CHLORIDE 0.9 % IV SOLN
INTRAVENOUS | Status: DC
  Administered 2013-09-29 – 2013-09-30 (×2): via INTRAVENOUS

## 2013-09-29 MED ORDER — ACETAMINOPHEN 325 MG PO TABS: 650.0000 mg | ORAL_TABLET | Freq: Four times a day (QID) | ORAL | Status: DC | PRN

## 2013-09-29 MED ORDER — SODIUM CHLORIDE 0.9 % IV BOLUS (SEPSIS)
250.0000 mL | Freq: Once | INTRAVENOUS | Status: AC
  Administered 2013-09-29: 250 mL via INTRAVENOUS

## 2013-09-29 NOTE — Progress Notes (Signed)
Admission note:  Arrival Method: from ED via stretcher Mental Orientation: A&Ox1 Telemetry: N/A Assessment: See docflowsheets Skin: Intact, dry skin to bilateral legs IV: NS@75  Lt hand Pain: No pain Tubes: N/A Safety Measures: Patient Handbook has been given, and discussed the Fall Prevention worksheet. Left at bedside. Safety video viewed. Admission: Completed and admission orders have been written  6E Orientation: Patient has been oriented to the unit, staff and to the room.  Family: At the bedside

## 2013-09-29 NOTE — ED Provider Notes (Signed)
CSN: [id number]     Arrival date & time 09/29/13  1356 History   First MD Initiated Contact with Patient 09/29/13 1419     Chief Complaint  Patient presents with  . Fatigue   (Consider location/radiation/quality/duration/timing/severity/associated sxs/prior Treatment) Patient is a 80 y.o. female presenting with weakness. The history is provided by the patient and a relative. No language interpreter was used.  Weakness This is a new problem. The current episode started today. Associated symptoms include chills, nausea and weakness. Pertinent negatives include no chest pain, congestion, coughing, fever, myalgias, rash, sore throat or vomiting. Associated symptoms comments: Per daughter at bedside, the patient was at her normal level of functioning this morning, when they went out for their weekly shopping trip. The patient became suddenly weak, stated she did not feel well but denied pain or nausea at that time. She experienced nausea without vomiting shortly after onset of weakness. She has not had a cough, no fever, dysuria, diarrhea and continues to deny pain symptoms. .    Past Medical History  Diagnosis Date  . Carotid artery occlusion   . Hypertension   . CHF (congestive heart failure)   . Thyroid disease     Hypothyroidism  . GERD (gastroesophageal reflux disease)   . Hiatal hernia   . Renal artery stenosis   . CKD (chronic kidney disease)    Past Surgical History  Procedure Laterality Date  . Carotid endarterectomy  Feb. 22, 2010    Right cea  . Coronary artery bypass graft    . Abdominal hysterectomy  1993   Family History  Problem Relation Age of Onset  . Family history unknown: Yes   History  Substance Use Topics  . Smoking status: Never Smoker   . Smokeless tobacco: Never Used  . Alcohol Use: No   OB History   Grav Para Term Preterm Abortions TAB SAB Ect Mult Living                 Review of Systems  Constitutional: Positive for chills. Negative for fever.   HENT: Negative for congestion and sore throat.   Eyes: Negative for visual disturbance.  Respiratory: Negative for cough and shortness of breath.   Cardiovascular: Negative for chest pain.  Gastrointestinal: Positive for nausea. Negative for vomiting.  Genitourinary: Negative for dysuria.  Musculoskeletal: Negative for myalgias.  Skin: Negative for rash.  Neurological: Positive for weakness and light-headedness.    Allergies  Codeine; Morphine and related; and Lasix  Home Medications   Current Outpatient Rx  Name  Route  Sig  Dispense  Refill  . amLODipine (NORVASC) 5 MG tablet   Oral   Take 5 mg by mouth daily.         Marland Kitchen aspirin 81 MG tablet   Oral   Take 81 mg by mouth daily.         Marland Kitchen levothyroxine (SYNTHROID, LEVOTHROID) 50 MCG tablet   Oral   Take 50 mcg by mouth daily.          BP 155/72  Pulse 66  Resp 18  SpO2 92% Physical Exam  Constitutional: She is oriented to person, place, and time. She appears well-developed and well-nourished. No distress.  HENT:  Head: Normocephalic.  Mouth/Throat: Oropharynx is clear and moist.  Eyes: Conjunctivae are normal.  Neck: Normal range of motion. Neck supple.  Cardiovascular: Normal rate and regular rhythm.   No murmur heard. Pulmonary/Chest: Effort normal and breath sounds normal.  Abdominal: Soft. Bowel  sounds are normal. There is no tenderness. There is no rebound and no guarding.  Musculoskeletal: Normal range of motion. She exhibits no edema.  Neurological: She is alert and oriented to person, place, and time.  Skin: Skin is warm and dry. No rash noted.  Psychiatric: She has a normal mood and affect.    ED Course  Procedures (including critical care time) Labs Review Labs Reviewed  URINE CULTURE  CBC WITH DIFFERENTIAL  BASIC METABOLIC PANEL  TROPONIN I  URINALYSIS, ROUTINE W REFLEX MICROSCOPIC   Imaging Review Dg Chest Portable 1 View  09/29/2013   CLINICAL DATA:  Fatigue  EXAM: PORTABLE CHEST - 1  VIEW  COMPARISON:  June 07, 2009  FINDINGS: There is stable cardiomegaly. Sizable hiatal type hernia is present. There is mild edema in the bases as well as scattered areas of scarring. There is pulmonary venous hypertension. No airspace consolidation. There is extensive atherosclerotic change in aorta. Bones are osteoporotic.  IMPRESSION: There is felt to be a degree of underlying congestive heart failure. Large hiatal type hernia present. Extensive atherosclerotic change present. Patient's status post mammary artery bypass grafting.   Electronically Signed   By: Bretta BangWilliam  Woodruff M.D.   On: 09/29/2013 15:03    EKG Interpretation    Date/Time:  Wednesday September 29 2013 14:14:43 EST Ventricular Rate:  63 PR Interval:  159 QRS Duration: 112 QT Interval:  470 QTC Calculation: 481 R Axis:   -27 Text Interpretation:  Sinus rhythm Atrial premature complex Anterior infarct, old Nonspecific T abnormalities, lateral leads Minimal ST elevation, inferior leads Baseline wander in lead(s) V1 V3 V6 Confirmed by ZACKOWSKI  MD, SCOTT ([id number]) on 09/29/2013 2:22:24 PM            MDM  No diagnosis found. 1. Weakness  89-yo patient with sudden onset weakness, and chills. Labs, imaging pending. Patient care transferred to Mellody DrownLauren Parker, PA-C for disposition decisions.    Arnoldo HookerShari A Paschal Blanton, PA-C 09/29/13 1526

## 2013-09-29 NOTE — ED Notes (Signed)
Pt asked if she could provide a urine sample. Pt sts she can't right now. Will give pt a few mins before trying.

## 2013-09-29 NOTE — Progress Notes (Signed)
Patient's family requests that lipitor not be given to patient.

## 2013-09-29 NOTE — ED Notes (Signed)
Christy, RN called back, sts per pt placement the pt needs to be held until they move her room closer to the nursing station.

## 2013-09-29 NOTE — ED Notes (Addendum)
Per EMS - pt coming from home. Pt was out with family when she started to feel weak brought her back home and then she still contiuned to feel weak. Pt is c/o dizziness, hx of vertigo. BP 140/80. HR 70NSR. Pt has urinary incontinence, family reports this is new. 20 G in left hand. No slurred speech. Equal hand grips.

## 2013-09-29 NOTE — H&P (Signed)
[facility] History and Physical  KANDISS IHRIG ZOX:[id number] DOB: 14-Jun-1918 DOA: 09/29/2013  Referring physician: Clabe Seal, PA-C PCP: Hayden Rasmussen, NP   Chief Complaint: Generalized weakness  HPI: [name] is a 80 y.o. female the past medical history of hypertension and hypothyroidism, patient came in to the hospital because of generalized weakness. Patient was in her usual state of health until this morning which is her breakfast and she went out with her 2 daughters, while she is walking in a store she developed weakness and nausea. He thought initially might be secondary to being too hungry so they went out to get her something to it and she was very nauseated and was not even able to get out of the car. 911 called and patient brought to the hospital because of generalized weakness. Upon initial evaluation in the ED she was found to have acute kidney injury with creatinine of 1.4, BUN is 24. Patient admitted to the hospital for further evaluation.  Review of Systems:  Constitutional:  No weight loss, night sweats, Fevers, chills, fatigue.  HEENT:  No headaches, Difficulty swallowing,Tooth/dental problems,Sore throat,  No sneezing, itching, ear ache, nasal congestion, post nasal drip,  Cardio-vascular:  No chest pain, Orthopnea, PND, swelling in lower extremities, anasarca, dizziness, palpitations  GI:  No heartburn, indigestion, abdominal pain, nausea, vomiting, diarrhea, change in bowel habits, loss of appetite  Resp:  No shortness of breath with exertion or at rest. No excess mucus, no productive cough, No non-productive cough, No coughing up of blood.No change in color of mucus.No wheezing.No chest wall deformity  Skin:  no rash or lesions.  GU:  no dysuria, change in color of urine, no urgency or frequency. No flank pain.  Musculoskeletal:  No joint pain or swelling. No decreased range of motion. No back pain.  Psych:  No change in mood or affect. No depression  or anxiety. No memory loss.   Past Medical History  Diagnosis Date  . Carotid artery occlusion   . Hypertension   . CHF (congestive heart failure)   . Thyroid disease     Hypothyroidism  . GERD (gastroesophageal reflux disease)   . Hiatal hernia   . Renal artery stenosis   . CKD (chronic kidney disease)    Past Surgical History  Procedure Laterality Date  . Carotid endarterectomy  Feb. 22, 2010    Right cea  . Coronary artery bypass graft    . Abdominal hysterectomy  1993   Social History:  reports that she has never smoked. She has never used smokeless tobacco. She reports that she does not drink alcohol or use illicit drugs.  Allergies  Allergen Reactions  . Codeine     Hallucination   . Morphine And Related     Hallucination  . Lasix [Furosemide] Nausea Only    Cramps    No family history on file.   Prior to Admission medications   Medication Sig Start Date End Date Taking? Authorizing Provider  amLODipine (NORVASC) 5 MG tablet Take 5 mg by mouth every evening.   Yes Historical Provider, MD  aspirin 81 MG tablet Take 81 mg by mouth daily as needed.    Yes Historical Provider, MD  donepezil (ARICEPT) 5 MG tablet Take 5 mg by mouth every morning.   Yes Historical Provider, MD  levothyroxine (SYNTHROID, LEVOTHROID) 75 MCG tablet Take 75 mcg by mouth daily before breakfast.   Yes Historical Provider, MD  Pitavastatin Calcium (LIVALO) 2 MG TABS Take  2 mg by mouth every evening.   Yes Historical Provider, MD   Physical Exam: Filed Vitals:   09/29/13 1739  BP: 126/68  Pulse: 65  Resp: 20    BP 126/68  Pulse 65  Resp 20  SpO2 93%  General:  Appears calm and comfortable Eyes: PERRL, normal lids, irises & conjunctiva ENT: grossly normal hearing, lips & tongue Neck: no LAD, masses or thyromegaly Cardiovascular: RRR, no m/r/g. No LE edema. Telemetry: SR, no arrhythmias  Respiratory: CTA bilaterally, no w/r/r. Normal respiratory effort. Abdomen: soft, ntnd Skin:  no rash or induration seen on limited exam Musculoskeletal: grossly normal tone BUE/BLE Psychiatric: grossly normal mood and affect, speech fluent and appropriate Neurologic: grossly non-focal.          Labs on Admission:  Basic Metabolic Panel:  Recent Labs Lab 09/29/13 1500  NA 135*  K 4.4  CL 95*  CO2 23  GLUCOSE 147*  BUN 24*  CREATININE 1.41*  CALCIUM 9.7   Liver Function Tests: No results found for this basename: AST, ALT, ALKPHOS, BILITOT, PROT, ALBUMIN,  in the last 168 hours No results found for this basename: LIPASE, AMYLASE,  in the last 168 hours No results found for this basename: AMMONIA,  in the last 168 hours CBC:  Recent Labs Lab 09/29/13 1500  WBC 7.5  NEUTROABS 6.5  HGB 13.2  HCT 38.2  MCV 98.7  PLT 172   Cardiac Enzymes:  Recent Labs Lab 09/29/13 1500  TROPONINI <0.30    BNP (last 3 results) No results found for this basename: PROBNP,  in the last 8760 hours CBG: No results found for this basename: GLUCAP,  in the last 168 hours  Radiological Exams on Admission: Dg Chest Portable 1 View  09/29/2013   CLINICAL DATA:  Fatigue  EXAM: PORTABLE CHEST - 1 VIEW  COMPARISON:  June 07, 2009  FINDINGS: There is stable cardiomegaly. Sizable hiatal type hernia is present. There is mild edema in the bases as well as scattered areas of scarring. There is pulmonary venous hypertension. No airspace consolidation. There is extensive atherosclerotic change in aorta. Bones are osteoporotic.  IMPRESSION: There is felt to be a degree of underlying congestive heart failure. Large hiatal type hernia present. Extensive atherosclerotic change present. Patient's status post mammary artery bypass grafting.   Electronically Signed   By: Bretta BangWilliam  Woodruff M.D.   On: 09/29/2013 15:03    EKG: Independently reviewed.   Assessment/Plan Active Problems:   Weakness generalized   HTN (hypertension)   Hypothyroid   Dementia   Acute kidney injury    Generalized  weakness -Patient daughters lives with her, says likely this is not poor oral intake. -Unclear etiology, but patient has acute kidney injury as well. -Hydrate with IV fluids, likely IV fluids can be discontinued in the morning. -Chest x-ray showed questionable CHF, clinical examination showed very dehydrated patient.  Acute kidney injury -Likely secondary to dehydration, patient started on IV fluids. -I will check BMP in the morning, no nephrotoxic medications identified.  Hypothyroidism -Check TSH, patient is on Synthroid continue home dose.  Hypertension -Seems to be controlled, continue home medications.  Dementia -Continue Aricept, patient has high risk to develop sundowning tonight.  Code Status: Full code Family Communication: Plan discussed with the patient in the presence of her 2 daughters at bedside. Disposition Plan: Observation, likely to be discharged in a.m. if creatinine is better   Time spent:  70 minutes  [facility] Surgery CenterELMAHI,Damarco Keysor A [facility] Pager [phone]

## 2013-09-29 NOTE — ED Notes (Signed)
PA at bedside.

## 2013-09-29 NOTE — ED Provider Notes (Signed)
Patient care assumed from CenterPoint EnergyShari A Upstill, PA-C Abrupt onset of generalized weakness and chills today.  No LOC, no neurologic deficits on exam. Awaiting lab results Pt in NAD, requesting to go home.  No lower extremity edema. Oxygen saturation 94%RA.  DG Chest Portable 1 View (Final result)  Result time: 09/29/13 15:03:45    Final result by Rad Results In Interface (09/29/13 15:03:45)    Narrative:   CLINICAL DATA: Fatigue  EXAM: PORTABLE CHEST - 1 VIEW  COMPARISON: June 07, 2009  FINDINGS: There is stable cardiomegaly. Sizable hiatal type hernia is present. There is mild edema in the bases as well as scattered areas of scarring. There is pulmonary venous hypertension. No airspace consolidation. There is extensive atherosclerotic change in aorta. Bones are osteoporotic.  IMPRESSION: There is felt to be a degree of underlying congestive heart failure. Large hiatal type hernia present. Extensive atherosclerotic change present. Patient's status post mammary artery bypass grafting.   Electronically Signed By: Bretta BangWilliam Woodruff M.D. On: 09/29/2013 15:03     Labs Reviewed  CBC WITH DIFFERENTIAL - Abnormal; Notable for the following:    MCH 34.1 (*)    Neutrophils Relative % 87 (*)    Lymphocytes Relative 8 (*)    Lymphs Abs 0.6 (*)    All other components within normal limits  BASIC METABOLIC PANEL - Abnormal; Notable for the following:    Sodium 135 (*)    Chloride 95 (*)    Glucose, Bld 147 (*)    BUN 24 (*)    Creatinine, Ser 1.41 (*)    GFR calc non Af Amer 31 (*)    GFR calc Af Amer 36 (*)    All other components within normal limits  URINALYSIS, ROUTINE W REFLEX MICROSCOPIC - Abnormal; Notable for the following:    Ketones, ur 15 (*)    All other components within normal limits  URINE CULTURE  TROPONIN I  PRO B NATRIURETIC PEPTIDE  TROPONIN I  TSH  TROPONIN I  TROPONIN I  BASIC METABOLIC PANEL  CBC    Discussed patient history and condition with Dr.  Rubin PayorPickering who advises admission for generalized weakness and acute kidney injury.  Consult to hospitalitlist for admission.  Dr. Arthor CaptainElmahi will evaluate the patient in the ED. #1 Acutekidney failure; specificity unknown  #2 Generalized weakness   Clabe SealLauren M Devondre Guzzetta, PA-C 09/30/13 0120

## 2013-09-30 LAB — BASIC METABOLIC PANEL
BUN: 21 mg/dL (ref 6–23)
CALCIUM: 8.9 mg/dL (ref 8.4–10.5)
CO2: 25 mEq/L (ref 19–32)
Chloride: 102 mEq/L (ref 96–112)
Creatinine, Ser: 1.25 mg/dL — ABNORMAL HIGH (ref 0.50–1.10)
GFR, EST AFRICAN AMERICAN: 42 mL/min — AB (ref >90)
GFR, EST NON AFRICAN AMERICAN: 36 mL/min — AB (ref >90)
Glucose, Bld: 76 mg/dL (ref 70–99)
POTASSIUM: 4.3 meq/L (ref 3.7–5.3)
SODIUM: 139 meq/L (ref 137–147)

## 2013-09-30 LAB — URINE CULTURE
Colony Count: NO GROWTH
Culture: NO GROWTH

## 2013-09-30 LAB — CBC
HCT: 33.6 % — ABNORMAL LOW (ref 36.0–46.0)
Hemoglobin: 11.6 g/dL — ABNORMAL LOW (ref 12.0–15.0)
MCH: 33.7 pg (ref 26.0–34.0)
MCHC: 34.5 g/dL (ref 30.0–36.0)
MCV: 97.7 fL (ref 78.0–100.0)
PLATELETS: 160 10*3/uL (ref 150–400)
RBC: 3.44 MIL/uL — AB (ref 3.87–5.11)
RDW: 13.7 % (ref 11.5–15.5)
WBC: 4.7 10*3/uL (ref 4.0–10.5)

## 2013-09-30 LAB — TSH: TSH: 42.898 u[IU]/mL — ABNORMAL HIGH (ref 0.350–4.500)

## 2013-09-30 LAB — TROPONIN I

## 2013-09-30 MED ORDER — PNEUMOCOCCAL VAC POLYVALENT 25 MCG/0.5ML IJ INJ
0.5000 mL | INJECTION | Freq: Once | INTRAMUSCULAR | Status: AC
  Administered 2013-09-30: 0.5 mL via INTRAMUSCULAR
  Filled 2013-09-30: qty 0.5

## 2013-09-30 MED ORDER — ASPIRIN EC 81 MG PO TBEC
81.0000 mg | DELAYED_RELEASE_TABLET | Freq: Every day | ORAL | Status: DC
  Administered 2013-09-30: 81 mg via ORAL
  Filled 2013-09-30: qty 1

## 2013-09-30 NOTE — Progress Notes (Signed)
09/30/2013 pneumococcal vaccine given in left deltoid at 1026. Lot# [id number] and expire June, 5, 2016. [facility] Lice[facility] RN.

## 2013-09-30 NOTE — Discharge Summary (Signed)
Physician Discharge Summary  Jamal Collinellie S Lafontant ZOX:[id number] DOB: 11/27/1917 DOA: 09/29/2013  PCP: Hayden RasmussenMABE,DAVID, NP  Admit date: 09/29/2013 Discharge date: 09/30/2013  Time spent: 35 minutes  Recommendations for Outpatient Follow-up:  1. BMp 2 weeks  Discharge Diagnoses:  Active Problems:   Weakness generalized   HTN (hypertension)   Hypothyroid   Dementia   Acute kidney injury   Discharge Condition: improved  Diet recommendation: regular  There were no vitals filed for this visit.  History of present illness:  [name] is a 80 y.o. female the past medical history of hypertension and hypothyroidism, patient came in to the hospital because of generalized weakness. Patient was in her usual state of health until this morning which is her breakfast and she went out with her 2 daughters, while she is walking in a store she developed weakness and nausea. He thought initially might be secondary to being too hungry so they went out to get her something to it and she was very nauseated and was not even able to get out of the car. 911 called and patient brought to the hospital because of generalized weakness.  Upon initial evaluation in the ED she was found to have acute kidney injury with creatinine of 1.4, BUN is 24. Patient admitted to the hospital for further evaluation.   Hospital Course:  Generalized weakness  -Patient daughters lives with her.  -Unclear etiology, but patient has acute kidney injury as well.   As well as TSH of 40+ -Hydrate with IV fluids  Acute kidney injury  -pre-renal -encourage PO intake BMP 2 weeks  Hypothyroidism  -TSH elevated Encourage ppt to take synthroid  Hypertension  -Seems to be controlled, continue home medications.   Dementia  -d/c Aricept as this can cause dizziness as well as patient has not been taking regularly   Procedures:  none  Consultations:  none  Discharge Exam: Filed Vitals:   09/30/13 0849  BP: 142/70  Pulse: 64   Temp: 97.9 F (36.6 C)  Resp: 18    General: pleasant Cardiovascular: rrr Respiratory: clear  Discharge Instructions  Discharge Orders   Future Orders Complete By Expires   Discharge instructions  As directed    Comments:     24 hour care by family BMp 2 weeks re Cr -encourage synthroid on empty stomach   Increase activity slowly  As directed        Medication List    STOP taking these medications       donepezil 5 MG tablet  Commonly known as:  ARICEPT      TAKE these medications       amLODipine 5 MG tablet  Commonly known as:  NORVASC  Take 5 mg by mouth every evening.     aspirin 81 MG tablet  Take 81 mg by mouth daily as needed.     levothyroxine 75 MCG tablet  Commonly known as:  SYNTHROID, LEVOTHROID  Take 75 mcg by mouth daily before breakfast.     LIVALO 2 MG Tabs  Generic drug:  Pitavastatin Calcium  Take 2 mg by mouth every evening.       Allergies  Allergen Reactions  . Codeine     Hallucination   . Morphine And Related     Hallucination  . Lasix [Furosemide] Nausea Only    Cramps      The results of significant diagnostics from this hospitalization (including imaging, microbiology, ancillary and laboratory) are listed below for reference.  Significant Diagnostic Studies: Dg Chest Portable 1 View  09/29/2013   CLINICAL DATA:  Fatigue  EXAM: PORTABLE CHEST - 1 VIEW  COMPARISON:  June 07, 2009  FINDINGS: There is stable cardiomegaly. Sizable hiatal type hernia is present. There is mild edema in the bases as well as scattered areas of scarring. There is pulmonary venous hypertension. No airspace consolidation. There is extensive atherosclerotic change in aorta. Bones are osteoporotic.  IMPRESSION: There is felt to be a degree of underlying congestive heart failure. Large hiatal type hernia present. Extensive atherosclerotic change present. Patient's status post mammary artery bypass grafting.   Electronically Signed   By: Bretta Bang M.D.   On: 09/29/2013 15:03    Microbiology: No results found for this or any previous visit (from the past 240 hour(s)).   Labs: Basic Metabolic Panel:  Recent Labs Lab 09/29/13 1500 09/30/13 0105  NA 135* 139  K 4.4 4.3  CL 95* 102  CO2 23 25  GLUCOSE 147* 76  BUN 24* 21  CREATININE 1.41* 1.25*  CALCIUM 9.7 8.9   Liver Function Tests: No results found for this basename: AST, ALT, ALKPHOS, BILITOT, PROT, ALBUMIN,  in the last 168 hours No results found for this basename: LIPASE, AMYLASE,  in the last 168 hours No results found for this basename: AMMONIA,  in the last 168 hours CBC:  Recent Labs Lab 09/29/13 1500 09/30/13 0105  WBC 7.5 4.7  NEUTROABS 6.5  --   HGB 13.2 11.6*  HCT 38.2 33.6*  MCV 98.7 97.7  PLT 172 160   Cardiac Enzymes:  Recent Labs Lab 09/29/13 1500 09/29/13 2054 09/30/13 0105 09/30/13 0950  TROPONINI <0.30 <0.30 <0.30 <0.30   BNP: BNP (last 3 results)  Recent Labs  09/29/13 1500  PROBNP 279.8   CBG: No results found for this basename: GLUCAP,  in the last 168 hours     Signed:  Benjamine Mola, Jenisse Vullo  [facility] 09/30/2013, 1:44 PM

## 2013-10-02 ENCOUNTER — Emergency Department (HOSPITAL_COMMUNITY)
Admission: EM | Admit: 2013-10-02 | Discharge: 2013-10-02 | Disposition: A | Discharge status: Home / Self Care | Payer: Medicare Other | Attending: Emergency Medicine | Admitting: Emergency Medicine

## 2013-10-02 ENCOUNTER — Emergency Department (HOSPITAL_COMMUNITY): Payer: Medicare Other

## 2013-10-02 ENCOUNTER — Encounter (HOSPITAL_COMMUNITY): Payer: Self-pay | Admitting: Emergency Medicine

## 2013-10-02 DIAGNOSIS — Z7982 Long term (current) use of aspirin: Secondary | ICD-10-CM | POA: Insufficient documentation

## 2013-10-02 DIAGNOSIS — Z8719 Personal history of other diseases of the digestive system: Secondary | ICD-10-CM | POA: Insufficient documentation

## 2013-10-02 DIAGNOSIS — Z79899 Other long term (current) drug therapy: Secondary | ICD-10-CM | POA: Insufficient documentation

## 2013-10-02 DIAGNOSIS — M79605 Pain in left leg: Secondary | ICD-10-CM

## 2013-10-02 DIAGNOSIS — I509 Heart failure, unspecified: Secondary | ICD-10-CM | POA: Insufficient documentation

## 2013-10-02 DIAGNOSIS — E039 Hypothyroidism, unspecified: Secondary | ICD-10-CM | POA: Insufficient documentation

## 2013-10-02 DIAGNOSIS — N189 Chronic kidney disease, unspecified: Secondary | ICD-10-CM | POA: Insufficient documentation

## 2013-10-02 DIAGNOSIS — F0390 Unspecified dementia without behavioral disturbance: Secondary | ICD-10-CM | POA: Insufficient documentation

## 2013-10-02 DIAGNOSIS — I129 Hypertensive chronic kidney disease with stage 1 through stage 4 chronic kidney disease, or unspecified chronic kidney disease: Secondary | ICD-10-CM | POA: Insufficient documentation

## 2013-10-02 DIAGNOSIS — M25579 Pain in unspecified ankle and joints of unspecified foot: Secondary | ICD-10-CM | POA: Insufficient documentation

## 2013-10-02 DIAGNOSIS — H9190 Unspecified hearing loss, unspecified ear: Secondary | ICD-10-CM | POA: Insufficient documentation

## 2013-10-02 DIAGNOSIS — Z951 Presence of aortocoronary bypass graft: Secondary | ICD-10-CM | POA: Insufficient documentation

## 2013-10-02 MED ORDER — ACETAMINOPHEN 325 MG PO TABS
650.0000 mg | ORAL_TABLET | Freq: Once | ORAL | Status: AC
  Administered 2013-10-02: 650 mg via ORAL
  Filled 2013-10-02: qty 2

## 2013-10-02 NOTE — ED Provider Notes (Signed)
Medical screening examination/treatment/procedure(s) were conducted as a shared visit with non-physician practitioner(s) and myself.  I personally evaluated the patient during the encounter.   Results for orders placed during the hospital encounter of 09/29/13  URINE CULTURE      Result Value Range   Specimen Description URINE, CATHETERIZED     Special Requests NONE     Culture  Setup Time       Value: 09/29/2013 22:24     Performed at [facility]   Colony Count       Value: NO GROWTH     Performed at [facility]   Culture       Value: NO GROWTH     Performed at [facility]   Report Status 09/30/2013 FINAL    CBC WITH DIFFERENTIAL      Result Value Range   WBC 7.5  4.0 - 10.5 K/uL   RBC 3.87  3.87 - 5.11 MIL/uL   Hemoglobin 13.2  12.0 - 15.0 g/dL   HCT 95.6  38.7 - 56.4 %   MCV 98.7  78.0 - 100.0 fL   MCH 34.1 (*) 26.0 - 34.0 pg   MCHC 34.6  30.0 - 36.0 g/dL   RDW 33.2  95.1 - 88.4 %   Platelets 172  150 - 400 K/uL   Neutrophils Relative % 87 (*) 43 - 77 %   Neutro Abs 6.5  1.7 - 7.7 K/uL   Lymphocytes Relative 8 (*) 12 - 46 %   Lymphs Abs 0.6 (*) 0.7 - 4.0 K/uL   Monocytes Relative 4  3 - 12 %   Monocytes Absolute 0.3  0.1 - 1.0 K/uL   Eosinophils Relative 1  0 - 5 %   Eosinophils Absolute 0.1  0.0 - 0.7 K/uL   Basophils Relative 0  0 - 1 %   Basophils Absolute 0.0  0.0 - 0.1 K/uL  BASIC METABOLIC PANEL      Result Value Range   Sodium 135 (*) 137 - 147 mEq/L   Potassium 4.4  3.7 - 5.3 mEq/L   Chloride 95 (*) 96 - 112 mEq/L   CO2 23  19 - 32 mEq/L   Glucose, Bld 147 (*) 70 - 99 mg/dL   BUN 24 (*) 6 - 23 mg/dL   Creatinine, Ser 1.66 (*) 0.50 - 1.10 mg/dL   Calcium 9.7  8.4 - 06.3 mg/dL   GFR calc non Af Amer 31 (*) >90 mL/min   GFR calc Af Amer 36 (*) >90 mL/min  TROPONIN I      Result Value Range   Troponin I <0.30  <0.30 ng/mL  URINALYSIS, ROUTINE W REFLEX MICROSCOPIC      Result Value Range   Color, Urine YELLOW  YELLOW    APPearance CLEAR  CLEAR   Specific Gravity, Urine 1.014  1.005 - 1.030   pH 6.0  5.0 - 8.0   Glucose, UA NEGATIVE  NEGATIVE mg/dL   Hgb urine dipstick NEGATIVE  NEGATIVE   Bilirubin Urine NEGATIVE  NEGATIVE   Ketones, ur 15 (*) NEGATIVE mg/dL   Protein, ur NEGATIVE  NEGATIVE mg/dL   Urobilinogen, UA 0.2  0.0 - 1.0 mg/dL   Nitrite NEGATIVE  NEGATIVE   Leukocytes, UA NEGATIVE  NEGATIVE  PRO B NATRIURETIC PEPTIDE      Result Value Range   Pro B Natriuretic peptide (BNP) 279.8  0 - 450 pg/mL  TSH      Result Value Range  TSH 42.898 (*) 0.350 - 4.500 uIU/mL  TROPONIN I      Result Value Range   Troponin I <0.30  <0.30 ng/mL  TROPONIN I      Result Value Range   Troponin I <0.30  <0.30 ng/mL  TROPONIN I      Result Value Range   Troponin I <0.30  <0.30 ng/mL  BASIC METABOLIC PANEL      Result Value Range   Sodium 139  137 - 147 mEq/L   Potassium 4.3  3.7 - 5.3 mEq/L   Chloride 102  96 - 112 mEq/L   CO2 25  19 - 32 mEq/L   Glucose, Bld 76  70 - 99 mg/dL   BUN 21  6 - 23 mg/dL   Creatinine, Ser 1.61 (*) 0.50 - 1.10 mg/dL   Calcium 8.9  8.4 - 09.6 mg/dL   GFR calc non Af Amer 36 (*) >90 mL/min   GFR calc Af Amer 42 (*) >90 mL/min  CBC      Result Value Range   WBC 4.7  4.0 - 10.5 K/uL   RBC 3.44 (*) 3.87 - 5.11 MIL/uL   Hemoglobin 11.6 (*) 12.0 - 15.0 g/dL   HCT 04.5 (*) 40.9 - 81.1 %   MCV 97.7  78.0 - 100.0 fL   MCH 33.7  26.0 - 34.0 pg   MCHC 34.5  30.0 - 36.0 g/dL   RDW 91.4  78.2 - 95.6 %   Platelets 160  150 - 400 K/uL   Dg Ankle Complete Left  10/02/2013   CLINICAL DATA:  Ankle pain for 2 weeks  EXAM: LEFT ANKLE COMPLETE - 3+ VIEW  COMPARISON:  None.  FINDINGS: There is no evidence of fracture, dislocation, or joint effusion. There is no evidence of arthropathy or other focal bone abnormality. Soft tissues are unremarkable.  IMPRESSION: No acute abnormality noted.   Electronically Signed   By: Alcide Clever M.D.   On: 10/02/2013 13:19   Dg Chest Portable 1  View  09/29/2013   CLINICAL DATA:  Fatigue  EXAM: PORTABLE CHEST - 1 VIEW  COMPARISON:  June 07, 2009  FINDINGS: There is stable cardiomegaly. Sizable hiatal type hernia is present. There is mild edema in the bases as well as scattered areas of scarring. There is pulmonary venous hypertension. No airspace consolidation. There is extensive atherosclerotic change in aorta. Bones are osteoporotic.  IMPRESSION: There is felt to be a degree of underlying congestive heart failure. Large hiatal type hernia present. Extensive atherosclerotic change present. Patient's status post mammary artery bypass grafting.   Electronically Signed   By: Bretta Bang M.D.   On: 09/29/2013 15:03   Dg Foot Complete Left  10/02/2013   CLINICAL DATA:  Left foot pain for 2 weeks  EXAM: LEFT FOOT - COMPLETE 3+ VIEW  COMPARISON:  None.  FINDINGS: Hallux valgus deformity is noted. Changes of prior surgery in the distal first metatarsal are seen. No acute fracture or dislocation is noted. No soft tissue changes are noted. Mild vascular calcifications are seen.  IMPRESSION: No acute abnormality noted.   Electronically Signed   By: Alcide Clever M.D.   On: 10/02/2013 15:67    80 year old female seen by me presenting with weakness. Portably started today associated with nausea. Patient's labs and imaging were still pending at the time that PA I went off service. Disposition would be determined by the lab and x-ray findings. Patient without any significant physical findings.  [name]  Lilli FewW. Jemila Camille, MD 10/02/13 2258

## 2013-10-02 NOTE — Progress Notes (Signed)
Weekend CSW met with patient and her 2 daughters at the bedside to provide support and assess for any community or home needs. Patient and daughters agreeable to speaking. Patient lives with her daughter Stanton Kidney, who is a strong support system and has home health services in place currently. Daughters report that patient is typically very independent. CSW provided daughters with a handout on ways to increase care for a loved one. Patient reports "I want to go home." Patient and family have no questions at this time and thanked CSW for assistance.   Tilden Fossa, MSW, [place] Clinical Social Worker [facility] Emergency Dept. [phone]

## 2013-10-02 NOTE — ED Notes (Signed)
Daughter stated , yesterday when she got up she couldn't walk and normally walks.  She just says she feels so bad.  Pt was assisted out of the car by [place], Charity fundraiserN.  Sister lives with the pt. Due to some dementia.

## 2013-10-02 NOTE — Progress Notes (Signed)
Met Patient and two daughters at bedside.Role of CM explained.Today's ED visit is secondary to patients foot pain.Patient resides at home with her daughter [name] who  Reports patient is usually Independent with her daily activities.[name] reports some medication compliance issues where her mother has refused to take her medications However they have made her PCP aware and maintain regular visits.Family have home health services with [facility].[phone].[name] requests a Bedside commode  And a walker this CM will liaise with the Home health agency and update patient / family. 

## 2013-10-02 NOTE — ED Notes (Addendum)
Patient walked around the unit with her walker. Patient was steady and favored her left leg, but the patient continued to put pressure on the left foot and walked without assistance. MD notified.

## 2013-10-02 NOTE — ED Provider Notes (Signed)
CSN: [id number]     Arrival date & time 10/02/13  [id number] History   First MD Initiated Contact with Patient 10/02/13 1044     Chief Complaint  Patient presents with  . Foot Pain   (Consider location/radiation/quality/duration/timing/severity/associated sxs/prior Treatment) HPI Low 5 caveat dementia history is obtained from patient and from patient's daughters. Complains of left foot pain and left ankle pain since yesterday. No trauma. Pain is worse with weight bearing. She was unable to walk this morning due to pain. No fever. She denies malaise. Denies "feeling bad" states the only thing bothering her presently is her left foot and left ankle. No treatment prior to coming here Past Medical History  Diagnosis Date  . Carotid artery occlusion   . Hypertension   . CHF (congestive heart failure)   . Thyroid disease     Hypothyroidism  . GERD (gastroesophageal reflux disease)   . Hiatal hernia   . Renal artery stenosis   . CKD (chronic kidney disease)    Past Surgical History  Procedure Laterality Date  . Carotid endarterectomy  Feb. 22, 2010    Right cea  . Coronary artery bypass graft    . Abdominal hysterectomy  1993   No family history on file. History  Substance Use Topics  . Smoking status: Never Smoker   . Smokeless tobacco: Never Used  . Alcohol Use: No   OB History   Grav Para Term Preterm Abortions TAB SAB Ect Mult Living                 Review of Systems  Unable to perform ROS: Dementia  HENT: Positive for hearing loss.        Chronically hard of hearing  Musculoskeletal: Positive for arthralgias.       Left ankle pain left foot pain    Allergies  Codeine; Morphine and related; and Lasix  Home Medications   Current Outpatient Rx  Name  Route  Sig  Dispense  Refill  . amLODipine (NORVASC) 5 MG tablet   Oral   Take 5 mg by mouth every evening.         Marland Kitchen. levothyroxine (SYNTHROID, LEVOTHROID) 75 MCG tablet   Oral   Take 75 mcg by mouth daily before  breakfast.         . Pitavastatin Calcium (LIVALO) 2 MG TABS   Oral   Take 2 mg by mouth every evening.         Marland Kitchen. aspirin 81 MG tablet   Oral   Take 81 mg by mouth daily as needed.           BP 122/80  Pulse 91  Temp(Src) 97.4 F (36.3 C) (Oral)  Resp 18  SpO2 98% Physical Exam  Nursing note and vitals reviewed. Constitutional: She appears well-developed and well-nourished. No distress.  HENT:  Head: Normocephalic and atraumatic.  Eyes: Conjunctivae are normal. Pupils are equal, round, and reactive to light.  Neck: Neck supple. No tracheal deviation present. No thyromegaly present.  Cardiovascular: Normal rate and regular rhythm.   No murmur heard. Pulmonary/Chest: Effort normal and breath sounds normal.  Abdominal: Soft. Bowel sounds are normal. She exhibits no distension. There is no tenderness.  Musculoskeletal: Normal range of motion. She exhibits no edema and no tenderness.  Left lower extremity, reddish ecchymosis over dorsum of foot. No swelling. No deformity. Tender over dorsum of foot. Ankle is nontender. DP pulse and PT pulse obtainable by Doppler. No temperature differential as compared  to right foot. Right foot nontender. DP pulse and PT pulses obtainable by Doppler. All other extremities nontender. Neurovascularly intact  Neurological: She is alert. No cranial nerve deficit. Coordination normal.  Skin: Skin is warm and dry. No rash noted.  Psychiatric: She has a normal mood and affect.    ED Course  Procedures (including critical care time) Labs Review Labs Reviewed - No data to display Imaging Review No results found.  EKG Interpretation   None      X-rays viewed by me And is improved after treatment with Tylenol. MDM  No diagnosis found. Case management was called arrange for bedside commode and walker. She was able to ambulate with a walker without difficulty. A contusion or sprain. No signs of infection or vascular insufficiency Diagnosis  pain in left lower extremity    Doug Sou, MD 10/02/13 [phone]

## 2013-10-02 NOTE — Discharge Instructions (Signed)
Use Tylenol every 4 hours as needed for pain. Ms. [name] should see her primary care Dr. if continue to have significant pain in a week. Use the walker as needed

## 2013-10-02 NOTE — ED Notes (Signed)
EDP at the bedside.  ?

## 2013-10-02 NOTE — Progress Notes (Signed)
CM updated patients family [facility] ([facility] don't cover DME.CHOICE options verbalized. [facility] and will deliver the  Requested equipment to patients bedside.CM continues to follow patient to ensure a safe discharge plan.Family aware we await DME.CM confirmed with [facility]- They do  Accept ([facility] [employer]/ MEDICARE-)

## 2013-10-04 NOTE — ED Provider Notes (Signed)
Medical screening examination/treatment/procedure(s) were performed by non-physician practitioner and as supervising physician I was immediately available for consultation/collaboration.     Juliet RudeNathan R. Rubin PayorPickering, MD 10/04/13 2042

## 2013-12-06 ENCOUNTER — Emergency Department (HOSPITAL_COMMUNITY)
Admission: EM | Admit: 2013-12-06 | Discharge: 2013-12-07 | Disposition: A | Discharge status: Home / Self Care | Payer: Medicare Other | Attending: Emergency Medicine | Admitting: Emergency Medicine

## 2013-12-06 ENCOUNTER — Encounter (HOSPITAL_COMMUNITY): Payer: Self-pay | Admitting: Emergency Medicine

## 2013-12-06 DIAGNOSIS — I509 Heart failure, unspecified: Secondary | ICD-10-CM | POA: Insufficient documentation

## 2013-12-06 DIAGNOSIS — N189 Chronic kidney disease, unspecified: Secondary | ICD-10-CM | POA: Diagnosis not present

## 2013-12-06 DIAGNOSIS — Z79899 Other long term (current) drug therapy: Secondary | ICD-10-CM | POA: Insufficient documentation

## 2013-12-06 DIAGNOSIS — I129 Hypertensive chronic kidney disease with stage 1 through stage 4 chronic kidney disease, or unspecified chronic kidney disease: Secondary | ICD-10-CM | POA: Diagnosis not present

## 2013-12-06 DIAGNOSIS — K5730 Diverticulosis of large intestine without perforation or abscess without bleeding: Secondary | ICD-10-CM | POA: Diagnosis not present

## 2013-12-06 DIAGNOSIS — R630 Anorexia: Secondary | ICD-10-CM | POA: Diagnosis not present

## 2013-12-06 DIAGNOSIS — Z8719 Personal history of other diseases of the digestive system: Secondary | ICD-10-CM | POA: Diagnosis not present

## 2013-12-06 DIAGNOSIS — R197 Diarrhea, unspecified: Secondary | ICD-10-CM | POA: Insufficient documentation

## 2013-12-06 DIAGNOSIS — R112 Nausea with vomiting, unspecified: Secondary | ICD-10-CM | POA: Insufficient documentation

## 2013-12-06 DIAGNOSIS — F202 Catatonic schizophrenia: Secondary | ICD-10-CM | POA: Diagnosis not present

## 2013-12-06 DIAGNOSIS — R609 Edema, unspecified: Secondary | ICD-10-CM | POA: Insufficient documentation

## 2013-12-06 DIAGNOSIS — E039 Hypothyroidism, unspecified: Secondary | ICD-10-CM | POA: Insufficient documentation

## 2013-12-06 DIAGNOSIS — Z90710 Acquired absence of both cervix and uterus: Secondary | ICD-10-CM | POA: Insufficient documentation

## 2013-12-06 DIAGNOSIS — Z7982 Long term (current) use of aspirin: Secondary | ICD-10-CM | POA: Insufficient documentation

## 2013-12-06 DIAGNOSIS — Z951 Presence of aortocoronary bypass graft: Secondary | ICD-10-CM | POA: Insufficient documentation

## 2013-12-06 LAB — LIPASE, BLOOD: Lipase: 61 U/L — ABNORMAL HIGH (ref 11–59)

## 2013-12-06 LAB — COMPREHENSIVE METABOLIC PANEL
ALT: 13 U/L (ref 0–35)
AST: 27 U/L (ref 0–37)
Albumin: 4.1 g/dL (ref 3.5–5.2)
Alkaline Phosphatase: 62 U/L (ref 39–117)
BUN: 40 mg/dL — ABNORMAL HIGH (ref 6–23)
CALCIUM: 9.8 mg/dL (ref 8.4–10.5)
CO2: 21 meq/L (ref 19–32)
Chloride: 101 mEq/L (ref 96–112)
Creatinine, Ser: 1.25 mg/dL — ABNORMAL HIGH (ref 0.50–1.10)
GFR, EST AFRICAN AMERICAN: 41 mL/min — AB (ref >90)
GFR, EST NON AFRICAN AMERICAN: 36 mL/min — AB (ref >90)
GLUCOSE: 127 mg/dL — AB (ref 70–99)
Potassium: 5 mEq/L (ref 3.7–5.3)
SODIUM: 140 meq/L (ref 137–147)
Total Bilirubin: 0.3 mg/dL (ref 0.3–1.2)
Total Protein: 8 g/dL (ref 6.0–8.3)

## 2013-12-06 LAB — CBC WITH DIFFERENTIAL/PLATELET
Basophils Absolute: 0 10*3/uL (ref 0.0–0.1)
Basophils Relative: 0 % (ref 0–1)
EOS PCT: 2 % (ref 0–5)
Eosinophils Absolute: 0.3 10*3/uL (ref 0.0–0.7)
HEMATOCRIT: 41.1 % (ref 36.0–46.0)
Hemoglobin: 13.5 g/dL (ref 12.0–15.0)
LYMPHS ABS: 0.3 10*3/uL — AB (ref 0.7–4.0)
Lymphocytes Relative: 2 % — ABNORMAL LOW (ref 12–46)
MCH: 31.8 pg (ref 26.0–34.0)
MCHC: 32.8 g/dL (ref 30.0–36.0)
MCV: 96.9 fL (ref 78.0–100.0)
MONO ABS: 0.7 10*3/uL (ref 0.1–1.0)
Monocytes Relative: 5 % (ref 3–12)
NEUTROS ABS: 11.5 10*3/uL — AB (ref 1.7–7.7)
Neutrophils Relative %: 91 % — ABNORMAL HIGH (ref 43–77)
Platelets: 213 10*3/uL (ref 150–400)
RBC: 4.24 MIL/uL (ref 3.87–5.11)
RDW: 12.9 % (ref 11.5–15.5)
WBC: 12.7 10*3/uL — AB (ref 4.0–10.5)

## 2013-12-06 LAB — TROPONIN I: Troponin I: <0.3 ng/mL (ref <0.30)

## 2013-12-06 LAB — I-STAT TROPONIN, ED: Troponin i, poc: 0.02 ng/mL (ref 0.00–0.08)

## 2013-12-06 MED ORDER — SODIUM CHLORIDE 0.9 % IV BOLUS (SEPSIS)
1000.0000 mL | Freq: Once | INTRAVENOUS | Status: AC
  Administered 2013-12-06: 1000 mL via INTRAVENOUS

## 2013-12-06 NOTE — ED Provider Notes (Signed)
CSN: [id number]     Arrival date & time 12/06/13  2117 History   First MD Initiated Contact with Patient 12/06/13 2150     Chief Complaint  Patient presents with  . Emesis  . Diarrhea     (Consider location/radiation/quality/duration/timing/severity/associated sxs/prior Treatment) HPI Comments: Patient is a [age] old female past medical history significant for carotid artery disease, hypertension, CHF, hypothyroidism, dictated, renal artery stenosis presented to the emergency department for acute onset nausea, multiple episodes nonbloody nonbilious emesis, 1-2 episodes of nonbloody diarrhea just prior to arrival. The emesis and diarrhea have resolved. Patient denies any abdominal pain. The family has stated that the patient has had decreased appetite since being switched to a new "fluid pill" last week for her lower extremity edema. Patient had not taken her fluid pill over the last few days until today as her edema has worsened. Denies any fevers, chills. Abdominal surgical history includes hysterectomy. Patient is a level V caveat d/t dementia.   Patient is a 80 y.o. female presenting with vomiting and diarrhea. The history is provided by the patient and a relative.  Emesis Associated symptoms: diarrhea   Diarrhea Associated symptoms: vomiting     Past Medical History  Diagnosis Date  . Carotid artery occlusion   . Hypertension   . CHF (congestive heart failure)   . Thyroid disease     Hypothyroidism  . GERD (gastroesophageal reflux disease)   . Hiatal hernia   . Renal artery stenosis   . CKD (chronic kidney disease)    Past Surgical History  Procedure Laterality Date  . Carotid endarterectomy  Feb. 22, 2010    Right cea  . Coronary artery bypass graft    . Abdominal hysterectomy  1993   No family history on file. History  Substance Use Topics  . Smoking status: Never Smoker   . Smokeless tobacco: Never Used  . Alcohol Use: No   OB History   Grav Para Term Preterm  Abortions TAB SAB Ect Mult Living                 Review of Systems  Unable to perform ROS: Dementia  Gastrointestinal: Positive for vomiting and diarrhea.      Allergies  Codeine; Morphine and related; and Lasix  Home Medications   Current Outpatient Rx  Name  Route  Sig  Dispense  Refill  . amLODipine (NORVASC) 5 MG tablet   Oral   Take 5 mg by mouth every evening.         Marland Kitchen. aspirin 81 MG tablet   Oral   Take 81 mg by mouth daily.          Marland Kitchen. levothyroxine (SYNTHROID, LEVOTHROID) 75 MCG tablet   Oral   Take 75 mcg by mouth daily before breakfast.         . Pitavastatin Calcium (LIVALO) 2 MG TABS   Oral   Take 2 mg by mouth every evening.          BP 108/85  Pulse 59  Temp(Src) 98 F (36.7 C) (Oral)  Resp 18  Ht 5\' 3"  (1.6 m)  Wt 94 lb (42.638 kg)  BMI 16.66 kg/m2  SpO2 95% Physical Exam  Nursing note and vitals reviewed. Constitutional: She appears well-developed and well-nourished. No distress.  HENT:  Head: Normocephalic and atraumatic.  Right Ear: External ear normal.  Left Ear: External ear normal.  Nose: Nose normal.  Mouth/Throat: Uvula is midline and oropharynx is clear and moist.  Mucous membranes are dry. No oropharyngeal exudate.  Eyes: Conjunctivae are normal.  Neck: Normal range of motion. Neck supple.  Cardiovascular: Normal rate, regular rhythm and normal heart sounds.   Pulmonary/Chest: Effort normal and breath sounds normal. No respiratory distress.  Abdominal: Soft. Bowel sounds are normal. She exhibits no distension. There is no tenderness. There is no rigidity, no rebound and no guarding.  Musculoskeletal: Normal range of motion. She exhibits no edema.  Neurological: She is alert. GCS eye subscore is 4. GCS verbal subscore is 5. GCS motor subscore is 6.  Oriented to self and situation. Moves all extremities w/o ataxia.   Skin: Skin is warm and dry. She is not diaphoretic.  Psychiatric: She has a normal mood and affect.    ED  Course  Procedures (including critical care time) Medications  iohexol (OMNIPAQUE) 300 MG/ML solution 20 mL (20 mLs Oral Contrast Given 12/07/13 0001)  sodium chloride 0.9 % bolus 1,000 mL (1,000 mLs Intravenous New Bag/Given 12/06/13 2323)  iohexol (OMNIPAQUE) 300 MG/ML solution 100 mL (80 mLs Intravenous Contrast Given 12/07/13 0057)    Labs Review Labs Reviewed  CBC WITH DIFFERENTIAL - Abnormal; Notable for the following:    WBC 12.7 (*)    Neutrophils Relative % 91 (*)    Neutro Abs 11.5 (*)    Lymphocytes Relative 2 (*)    Lymphs Abs 0.3 (*)    All other components within normal limits  COMPREHENSIVE METABOLIC PANEL - Abnormal; Notable for the following:    Glucose, Bld 127 (*)    BUN 40 (*)    Creatinine, Ser 1.25 (*)    GFR calc non Af Amer 36 (*)    GFR calc Af Amer 41 (*)    All other components within normal limits  LIPASE, BLOOD - Abnormal; Notable for the following:    Lipase 61 (*)    All other components within normal limits  URINE CULTURE  TROPONIN I  URINALYSIS, ROUTINE W REFLEX MICROSCOPIC  LACTIC ACID, PLASMA  I-STAT TROPOININ, ED  I-STAT CG4 LACTIC ACID, ED   Imaging Review Ct Abdomen Pelvis W Contrast  12/07/2013   CLINICAL DATA:  Nausea vomiting today after supper, vomiting and diarrhea  EXAM: CT ABDOMEN AND PELVIS WITH CONTRAST  TECHNIQUE: Multidetector CT imaging of the abdomen and pelvis was performed using the standard protocol following bolus administration of intravenous contrast.  CONTRAST:  80mL OMNIPAQUE IOHEXOL 300 MG/ML  SOLN  COMPARISON:  None.  FINDINGS: The lung bases are clear.  The liver demonstrates no focal abnormality. There is no intrahepatic or extrahepatic biliary ductal dilatation. There are cholelithiasis. The spleen demonstrates no focal abnormality. The kidneys, adrenal glands and pancreas are normal. The bladder is unremarkable.  There is a large hiatal hernia. The duodenum, small intestine, and large intestine demonstrate no contrast  extravasation or dilatation. There is diverticulosis without evidence of diverticulitis. There is no pneumoperitoneum, pneumatosis, or portal venous gas. There is no abdominal or pelvic free fluid. There is no lymphadenopathy.  There is abdominal aortic atherosclerosis. There is mild focal abdominal aortic ectasia of the infrarenal abdominal aorta.  There are no lytic or sclerotic osseous lesions. There is degenerative disc disease with disc height loss and broad-based disc bulges throughout the lumbar spine.  IMPRESSION: 1. Large hiatal hernia. 2. No bowel obstruction. 3. Cholelithiasis.   Electronically Signed   By: Elige Ko   On: 12/07/2013 01:24     EKG Interpretation   Date/Time:  Monday December 06 2013 21:31:55 EDT Ventricular Rate:  89 PR Interval:  154 QRS Duration: 118 QT Interval:  402 QTC Calculation: 489 R Axis:   -49 Text Interpretation:  Sinus rhythm with Premature supraventricular  complexes Left axis deviation Anteroseptal infarct , age undetermined  Abnormal ECG No significant change was found Confirmed by Manus GunningANCOUR  MD,  STEPHEN [phone]([id number]) on 12/06/2013 10:05:00 PM      MDM   Final diagnoses:  Nausea vomiting and diarrhea    Filed Vitals:   12/07/13 0000  BP: 108/85  Pulse: 59  Temp:   Resp: 18   Afebrile, NAD, non-toxic appearing, AAOx2 (baseline per family). Abdomen soft, non-tender, non-distended. 1 day history of nausea, NBNB emesis, NB diarrhea with history of abdominal hysterectomy. Will obtain CT abd/pelvis. I have reviewed nursing notes, vital signs, and all appropriate lab and imaging results for this patient. Labs reviewed. CT scan unremarkable for acute cause of nausea, vomiting, diarrhea. Abdomen remains benign on repeat exam. IV fluid bolus given. Patient sleeping comfortably on examination. Will discharge patient home with family with good return precautions. They are agreeable to this plan and will return the patient back to the emergency Department if  anything changes or worsens. Patient is stable at time of discharge. Patient d/w with Dr. Manus Gunningancour, agrees with plan.         Jeannetta EllisJennifer L Talissa Apple, PA-C 12/07/13 [phone]

## 2013-12-06 NOTE — ED Notes (Addendum)
Sudden onset N/V today after supper today.  Family ate same thing she did, no complaints.  V x 2, diarrhea x 1.  "feels like mud all over".    Zofran 4 mg IV by EMS, 22ga IV in left wrist.  Family reports pt has not been eating and drinking as well as she has.  Early dementia reported by family.

## 2013-12-07 ENCOUNTER — Emergency Department (HOSPITAL_COMMUNITY): Payer: Medicare Other

## 2013-12-07 LAB — URINALYSIS, ROUTINE W REFLEX MICROSCOPIC
BILIRUBIN URINE: NEGATIVE
GLUCOSE, UA: NEGATIVE mg/dL
Hgb urine dipstick: NEGATIVE
KETONES UR: NEGATIVE mg/dL
LEUKOCYTES UA: NEGATIVE
Nitrite: NEGATIVE
PH: 5 (ref 5.0–8.0)
Protein, ur: NEGATIVE mg/dL
Specific Gravity, Urine: 1.022 (ref 1.005–1.030)
Urobilinogen, UA: 0.2 mg/dL (ref 0.0–1.0)

## 2013-12-07 LAB — LACTIC ACID, PLASMA: Lactic Acid, Venous: 2 mmol/L (ref 0.5–2.2)

## 2013-12-07 LAB — I-STAT CG4 LACTIC ACID, ED: Lactic Acid, Venous: 2 mmol/L (ref 0.5–2.2)

## 2013-12-07 MED ORDER — IOHEXOL 300 MG/ML  SOLN
20.0000 mL | INTRAMUSCULAR | Status: AC
  Administered 2013-12-07: 20 mL via ORAL

## 2013-12-07 MED ORDER — IOHEXOL 300 MG/ML  SOLN
100.0000 mL | Freq: Once | INTRAMUSCULAR | Status: AC | PRN
  Administered 2013-12-07: 80 mL via INTRAVENOUS

## 2013-12-07 NOTE — Discharge Instructions (Signed)
Please follow up with your primary care physician in 1-2 days. If you do not have one please call the [facility] number listed above. Please read all discharge instructions and return precautions.   Nausea and Vomiting Nausea is a sick feeling that often comes before throwing up (vomiting). Vomiting is a reflex where stomach contents come out of your mouth. Vomiting can cause severe loss of body fluids (dehydration). Children and elderly adults can become dehydrated quickly, especially if they also have diarrhea. Nausea and vomiting are symptoms of a condition or disease. It is important to find the cause of your symptoms. CAUSES   Direct irritation of the stomach lining. This irritation can result from increased acid production (gastroesophageal reflux disease), infection, food poisoning, taking certain medicines (such as nonsteroidal anti-inflammatory drugs), alcohol use, or tobacco use.  Signals from the brain.These signals could be caused by a headache, heat exposure, an inner ear disturbance, increased pressure in the brain from injury, infection, a tumor, or a concussion, pain, emotional stimulus, or metabolic problems.  An obstruction in the gastrointestinal tract (bowel obstruction).  Illnesses such as diabetes, hepatitis, gallbladder problems, appendicitis, kidney problems, cancer, sepsis, atypical symptoms of a heart attack, or eating disorders.  Medical treatments such as chemotherapy and radiation.  Receiving medicine that makes you sleep (general anesthetic) during surgery. DIAGNOSIS Your caregiver may ask for tests to be done if the problems do not improve after a few days. Tests may also be done if symptoms are severe or if the reason for the nausea and vomiting is not clear. Tests may include:  Urine tests.  Blood tests.  Stool tests.  Cultures (to look for evidence of infection).  X-rays or other imaging studies. Test results can help your caregiver  make decisions about treatment or the need for additional tests. TREATMENT You need to stay well hydrated. Drink frequently but in small amounts.You may wish to drink water, sports drinks, clear broth, or eat frozen ice pops or gelatin dessert to help stay hydrated.When you eat, eating slowly may help prevent nausea.There are also some antinausea medicines that may help prevent nausea. HOME CARE INSTRUCTIONS   Take all medicine as directed by your caregiver.  If you do not have an appetite, do not force yourself to eat. However, you must continue to drink fluids.  If you have an appetite, eat a normal diet unless your caregiver tells you differently.  Eat a variety of complex carbohydrates (rice, wheat, potatoes, bread), lean meats, yogurt, fruits, and vegetables.  Avoid high-fat foods because they are more difficult to digest.  Drink enough water and fluids to keep your urine clear or pale yellow.  If you are dehydrated, ask your caregiver for specific rehydration instructions. Signs of dehydration may include:  Severe thirst.  Dry lips and mouth.  Dizziness.  Dark urine.  Decreasing urine frequency and amount.  Confusion.  Rapid breathing or pulse. SEEK IMMEDIATE MEDICAL CARE IF:   You have blood or brown flecks (like coffee grounds) in your vomit.  You have black or bloody stools.  You have a severe headache or stiff neck.  You are confused.  You have severe abdominal pain.  You have chest pain or trouble breathing.  You do not urinate at least once every 8 hours.  You develop cold or clammy skin.  You continue to vomit for longer than 24 to 48 hours.  You have a fever. MAKE SURE YOU:   Understand these instructions.  Will  watch your condition.  Will get help right away if you are not doing well or get worse. Document Released: 08/12/2005 Document Revised: 11/04/2011 Document Reviewed: 01/09/2011 [facility] Patient Information 2014 [place].  Diarrhea Diarrhea is frequent loose and watery bowel movements. It can cause you to feel weak and dehydrated. Dehydration can cause you to become tired and thirsty, have a dry mouth, and have decreased urination that often is dark yellow. Diarrhea is a sign of another problem, most often an infection that will not last long. In most cases, diarrhea typically lasts 2 3 days. However, it can last longer if it is a sign of something more serious. It is important to treat your diarrhea as directed by your caregive to lessen or prevent future episodes of diarrhea. CAUSES  Some common causes include:  Gastrointestinal infections caused by viruses, bacteria, or parasites.  Food poisoning or food allergies.  Certain medicines, such as antibiotics, chemotherapy, and laxatives.  Artificial sweeteners and fructose.  Digestive disorders. HOME CARE INSTRUCTIONS  Ensure adequate fluid intake (hydration): have 1 cup (8 oz) of fluid for each diarrhea episode. Avoid fluids that contain simple sugars or sports drinks, fruit juices, whole milk products, and sodas. Your urine should be clear or pale yellow if you are drinking enough fluids. Hydrate with an oral rehydration solution that you can purchase at pharmacies, retail stores, and online. You can prepare an oral rehydration solution at home by mixing the following ingredients together:    tsp table salt.   tsp baking soda.   tsp salt substitute containing potassium chloride.  1  tablespoons sugar.  1 L (34 oz) of water.  Certain foods and beverages may increase the speed at which food moves through the gastrointestinal (GI) tract. These foods and beverages should be avoided and include:  Caffeinated and alcoholic beverages.  High-fiber foods, such as raw fruits and vegetables, nuts, seeds, and whole grain breads and cereals.  Foods and beverages sweetened with sugar alcohols, such as xylitol, sorbitol, and mannitol.  Some foods may be  well tolerated and may help thicken stool including:  Starchy foods, such as rice, toast, pasta, low-sugar cereal, oatmeal, grits, baked potatoes, crackers, and bagels.  Bananas.  Applesauce.  Add probiotic-rich foods to help increase healthy bacteria in the GI tract, such as yogurt and fermented milk products.  Wash your hands well after each diarrhea episode.  Only take over-the-counter or prescription medicines as directed by your caregiver.  Take a warm bath to relieve any burning or pain from frequent diarrhea episodes. SEEK IMMEDIATE MEDICAL CARE IF:   You are unable to keep fluids down.  You have persistent vomiting.  You have blood in your stool, or your stools are black and tarry.  You do not urinate in 6 8 hours, or there is only a small amount of very dark urine.  You have abdominal pain that increases or localizes.  You have weakness, dizziness, confusion, or lightheadedness.  You have a severe headache.  Your diarrhea gets worse or does not get better.  You have a fever or persistent symptoms for more than 2 3 days.  You have a fever and your symptoms suddenly get worse. MAKE SURE YOU:   Understand these instructions.  Will watch your condition.  Will get help right away if you are not doing well or get worse. Document Released: 08/02/2002 Document Revised: 07/29/2012 Document Reviewed: 04/19/2012 [facility] Patient Information 2014 [place], [place].

## 2013-12-07 NOTE — ED Provider Notes (Signed)
Medical screening examination/treatment/procedure(s) were conducted as a shared visit with non-physician practitioner(s) and myself.  I personally evaluated the patient during the encounter.  Nausea, vomiting diarrhea of acute onset.  No CP, abdominal pain or back pain.  Patient with dementia, no acute distress Abdomen soft and nontender   EKG Interpretation   Date/Time:  Monday December 06 2013 21:31:55 EDT Ventricular Rate:  89 PR Interval:  154 QRS Duration: 118 QT Interval:  402 QTC Calculation: 489 R Axis:   -49 Text Interpretation:  Sinus rhythm with Premature supraventricular  complexes Left axis deviation Anteroseptal infarct , age undetermined  Abnormal ECG No significant change was found Confirmed by Manus GunningANCOUR  MD,  Tywanda Rice [phone]([id number]) on 12/06/2013 10:05:00 PM        Glynn OctaveStephen Malaya Cagley, MD 12/07/13 1506

## 2013-12-09 LAB — URINE CULTURE: Colony Count: >=100000

## 2013-12-10 NOTE — Progress Notes (Signed)
ED Antimicrobial Stewardship Positive Culture Follow Up   [name] is an 80 y.o. female who [facility] with a chief complaint of  Chief Complaint  Patient presents with  . Emesis  . Diarrhea    Recent Results (from the past 720 hour(s))  URINE CULTURE     Status: None   Collection Time    12/06/13 11:40 PM      Result Value Ref Range Status   Specimen Description URINE, CATHETERIZED   Final   Special Requests NONE   Final   Culture  Setup Time     Final   Value: 12/07/2013 00:08     [facility]   Colony Count     Final   Value: >=100,000 COLONIES/ML     [facility]   Culture     Final   Value: KLEBSIELLA PNEUMONIAE     [facility]formed at [facility]   Report Status 12/09/2013 FINAL   Final   Organism ID, Bacteria KLEBSIELLA PNEUMONIAE   Final   [x]  Patient discharged originally without antimicrobial agent and treatment is now indicated  New antibiotic prescription: Keflex PO 500mg  BID for 7 days  ED Provider: Lonie PeakUpstill, PA-C  Phelix Fudala E. Achilles Dunkope, PharmD Clinical Pharmacist - Resident Pager: [phone] Pharmacy: [phone] 12/10/2013 9:29 AM

## 2013-12-10 NOTE — ED Notes (Signed)
Post ED Visit - Positive Culture Follow-up: Successful Patient Follow-Up   Positive Urine culture  []  Patient discharged without antimicrobial prescription and treatment is now indicated [x]  Organism is resistant to prescribed ED discharge antimicrobial []  Patient with positive blood cultures  Changes discussed with ED provider:  Elpidio AnisShari Upstill New antibiotic prescription Keflex 500 BID x 7 days    Larena Soxonya Marie Nimra Puccinelli 12/10/2013, 6:33 PM

## 2014-03-02 ENCOUNTER — Other Ambulatory Visit (HOSPITAL_COMMUNITY): Payer: Self-pay | Admitting: *Deleted

## 2014-03-02 ENCOUNTER — Encounter (HOSPITAL_COMMUNITY)
Admission: RE | Admit: 2014-03-02 | Discharge: 2014-03-02 | Disposition: A | Discharge status: Home / Self Care | Payer: Medicare Other | Source: Ambulatory Visit | Attending: Internal Medicine | Admitting: Internal Medicine

## 2014-03-02 DIAGNOSIS — Z79899 Other long term (current) drug therapy: Secondary | ICD-10-CM | POA: Insufficient documentation

## 2014-03-02 DIAGNOSIS — Z5181 Encounter for therapeutic drug level monitoring: Secondary | ICD-10-CM | POA: Diagnosis not present

## 2014-03-02 DIAGNOSIS — E039 Hypothyroidism, unspecified: Secondary | ICD-10-CM | POA: Insufficient documentation

## 2014-03-02 MED ORDER — SODIUM CHLORIDE 0.9 % IV SOLN
INTRAVENOUS | Status: DC
  Administered 2014-03-02: 11:00:00 via INTRAVENOUS
  Administered 2014-03-02: 1000 mL via INTRAVENOUS

## 2015-03-03 ENCOUNTER — Encounter (HOSPITAL_COMMUNITY): Payer: Self-pay | Admitting: Emergency Medicine

## 2015-03-03 ENCOUNTER — Emergency Department (HOSPITAL_COMMUNITY): Payer: Medicare Other

## 2015-03-03 ENCOUNTER — Emergency Department (HOSPITAL_COMMUNITY)
Admission: EM | Admit: 2015-03-03 | Discharge: 2015-03-04 | Disposition: A | Discharge status: Home / Self Care | Payer: Medicare Other | Attending: Emergency Medicine | Admitting: Emergency Medicine

## 2015-03-03 DIAGNOSIS — Z7982 Long term (current) use of aspirin: Secondary | ICD-10-CM | POA: Insufficient documentation

## 2015-03-03 DIAGNOSIS — I129 Hypertensive chronic kidney disease with stage 1 through stage 4 chronic kidney disease, or unspecified chronic kidney disease: Secondary | ICD-10-CM | POA: Diagnosis not present

## 2015-03-03 DIAGNOSIS — E039 Hypothyroidism, unspecified: Secondary | ICD-10-CM | POA: Insufficient documentation

## 2015-03-03 DIAGNOSIS — Z8719 Personal history of other diseases of the digestive system: Secondary | ICD-10-CM | POA: Insufficient documentation

## 2015-03-03 DIAGNOSIS — N189 Chronic kidney disease, unspecified: Secondary | ICD-10-CM | POA: Insufficient documentation

## 2015-03-03 DIAGNOSIS — Z79899 Other long term (current) drug therapy: Secondary | ICD-10-CM | POA: Insufficient documentation

## 2015-03-03 DIAGNOSIS — Z9861 Coronary angioplasty status: Secondary | ICD-10-CM | POA: Diagnosis not present

## 2015-03-03 DIAGNOSIS — G309 Alzheimer's disease, unspecified: Secondary | ICD-10-CM | POA: Diagnosis not present

## 2015-03-03 DIAGNOSIS — E860 Dehydration: Secondary | ICD-10-CM | POA: Insufficient documentation

## 2015-03-03 DIAGNOSIS — I509 Heart failure, unspecified: Secondary | ICD-10-CM | POA: Insufficient documentation

## 2015-03-03 DIAGNOSIS — N3000 Acute cystitis without hematuria: Secondary | ICD-10-CM | POA: Diagnosis not present

## 2015-03-03 DIAGNOSIS — R5383 Other fatigue: Secondary | ICD-10-CM | POA: Diagnosis present

## 2015-03-03 LAB — BASIC METABOLIC PANEL
Anion gap: 7 (ref 5–15)
BUN: 23 mg/dL — ABNORMAL HIGH (ref 6–20)
CO2: 26 mmol/L (ref 22–32)
Calcium: 9.1 mg/dL (ref 8.9–10.3)
Chloride: 107 mmol/L (ref 101–111)
Creatinine, Ser: 1.12 mg/dL — ABNORMAL HIGH (ref 0.44–1.00)
GFR calc Af Amer: 47 mL/min — ABNORMAL LOW (ref >60)
GFR calc non Af Amer: 40 mL/min — ABNORMAL LOW (ref >60)
Glucose, Bld: 103 mg/dL — ABNORMAL HIGH (ref 65–99)
Potassium: 4.3 mmol/L (ref 3.5–5.1)
Sodium: 140 mmol/L (ref 135–145)

## 2015-03-03 LAB — URINE MICROSCOPIC-ADD ON

## 2015-03-03 LAB — CBC WITH DIFFERENTIAL/PLATELET
Basophils Absolute: 0 10*3/uL (ref 0.0–0.1)
Basophils Relative: 0 % (ref 0–1)
Eosinophils Absolute: 0.7 10*3/uL (ref 0.0–0.7)
Eosinophils Relative: 13 % — ABNORMAL HIGH (ref 0–5)
HCT: 41.6 % (ref 36.0–46.0)
Hemoglobin: 13.8 g/dL (ref 12.0–15.0)
Lymphocytes Relative: 27 % (ref 12–46)
Lymphs Abs: 1.5 10*3/uL (ref 0.7–4.0)
MCH: 31.8 pg (ref 26.0–34.0)
MCHC: 33.2 g/dL (ref 30.0–36.0)
MCV: 95.9 fL (ref 78.0–100.0)
Monocytes Absolute: 0.7 10*3/uL (ref 0.1–1.0)
Monocytes Relative: 12 % (ref 3–12)
Neutro Abs: 2.8 10*3/uL (ref 1.7–7.7)
Neutrophils Relative %: 48 % (ref 43–77)
Platelets: 171 10*3/uL (ref 150–400)
RBC: 4.34 MIL/uL (ref 3.87–5.11)
RDW: 13.3 % (ref 11.5–15.5)
WBC: 5.8 10*3/uL (ref 4.0–10.5)

## 2015-03-03 LAB — URINALYSIS, ROUTINE W REFLEX MICROSCOPIC
Bilirubin Urine: NEGATIVE
Glucose, UA: NEGATIVE mg/dL
Hgb urine dipstick: NEGATIVE
Ketones, ur: NEGATIVE mg/dL
Leukocytes, UA: NEGATIVE
Nitrite: POSITIVE — AB
Protein, ur: NEGATIVE mg/dL
Specific Gravity, Urine: 1.008 (ref 1.005–1.030)
Urobilinogen, UA: 0.2 mg/dL (ref 0.0–1.0)
pH: 6 (ref 5.0–8.0)

## 2015-03-03 LAB — TROPONIN I: Troponin I: <0.03 ng/mL (ref <0.031)

## 2015-03-03 LAB — I-STAT CG4 LACTIC ACID, ED: Lactic Acid, Venous: 1.85 mmol/L (ref 0.5–2.0)

## 2015-03-03 MED ORDER — CEFTRIAXONE SODIUM 1 G IJ SOLR
1.0000 g | Freq: Once | INTRAMUSCULAR | Status: AC
Start: 2015-03-03 — End: 2015-03-04
  Administered 2015-03-03: 1 g via INTRAVENOUS
  Filled 2015-03-03: qty 10

## 2015-03-03 MED ORDER — SODIUM CHLORIDE 0.9 % IV BOLUS (SEPSIS)
1000.0000 mL | Freq: Once | INTRAVENOUS | Status: AC
  Administered 2015-03-03: 1000 mL via INTRAVENOUS

## 2015-03-03 NOTE — ED Notes (Signed)
Brought patients family drinks and crackers.

## 2015-03-03 NOTE — ED Provider Notes (Signed)
Medical screening examination/treatment/procedure(s) were conducted as a shared visit with non-physician practitioner(s) and myself.  I personally evaluated the patient during the encounter.   EKG Interpretation None     ED ECG REPORT   Date: 03/03/2015  Rate: 78  Rhythm: normal sinus rhythm  QRS Axis: normal  Intervals: normal  ST/T Wave abnormalities: nonspecific ST changes  Conduction Disutrbances:left bundle branch block  Narrative Interpretation:   Old EKG Reviewed: none available  I have personally reviewed the EKG tracing and agree with the computerized printout as noted.   Patient here with weakness and without fever. Does have evidence of UTI. Neurological exam stable. IV fluids given she is doing much better. Also given IV Rocephin. We discharged home  Lorre NickAnthony Herrick Hartog, MD 03/03/15 2300

## 2015-03-03 NOTE — ED Notes (Signed)
Pt presents via EMS after family called reporting ongoing weakness and persistent fatigue for several weeks.  Pt is alert/oriented x 4 but is unable to pinpoint symptoms, stating only that she "feels bad in general."  She states that her weakness has been progressing slowly for months.  EMS reported vs within normal limits.  No meds or lines en route.

## 2015-03-04 MED ORDER — CEPHALEXIN 500 MG PO CAPS: 1000.0000 mg | ORAL_CAPSULE | Freq: Two times a day (BID) | ORAL | Status: DC

## 2015-03-04 NOTE — ED Provider Notes (Signed)
CSN: [id number]     Arrival date & time 03/03/15  1951 History   First MD Initiated Contact with Patient 03/03/15 2021     Chief Complaint  Patient presents with  . Fatigue     (Consider location/radiation/quality/duration/timing/severity/associated sxs/prior Treatment) HPI Patient presents to the emergency department with general weakness that started earlier today.  The patient has not been eating and drinking per her normal today.  Family states that she seems weak and unsteady.  The patient suffers from Alzheimer's disease is not able to give me any history.  Family states that she has not had any vomiting, diarrhea, fever, cough, or syncope.  The patient normally does not drink fluids very well per the family's report Past Medical History  Diagnosis Date  . Carotid artery occlusion   . Hypertension   . CHF (congestive heart failure)   . Thyroid disease     Hypothyroidism  . GERD (gastroesophageal reflux disease)   . Hiatal hernia   . Renal artery stenosis   . CKD (chronic kidney disease)    Past Surgical History  Procedure Laterality Date  . Carotid endarterectomy  Feb. 22, 2010    Right cea  . Coronary artery bypass graft    . Abdominal hysterectomy  1993   History reviewed. No pertinent family history. History  Substance Use Topics  . Smoking status: Never Smoker   . Smokeless tobacco: Never Used  . Alcohol Use: No   OB History    No data available     Review of Systems  Level V caveat applies due to Alzheimer's  Allergies  Codeine; Morphine and related; and Lasix  Home Medications   Prior to Admission medications   Medication Sig Start Date End Date Taking? Authorizing Provider  amLODipine (NORVASC) 5 MG tablet Take 5 mg by mouth every evening.   Yes Historical Provider, MD  aspirin 81 MG tablet Take 81 mg by mouth every other day.    Yes Historical Provider, MD  levothyroxine (SYNTHROID, LEVOTHROID) 75 MCG tablet Take 75 mcg by mouth daily before  breakfast.   Yes Historical Provider, MD  Pitavastatin Calcium (LIVALO) 2 MG TABS Take 2 mg by mouth every evening.   Yes Historical Provider, MD   BP 156/74 mmHg  Pulse 63  Temp(Src) 98.2 F (36.8 C) (Oral)  Resp 18  SpO2 93% Physical Exam  Constitutional: She appears well-developed and well-nourished. No distress.  HENT:  Head: Normocephalic and atraumatic.  Mouth/Throat: Oropharynx is clear and moist.  Eyes: Pupils are equal, round, and reactive to light.  Neck: Normal range of motion. Neck supple.  Cardiovascular: Normal rate, regular rhythm and normal heart sounds.  Exam reveals no gallop and no friction rub.   No murmur heard. Pulmonary/Chest: Effort normal and breath sounds normal. No respiratory distress.  Abdominal: Soft. Bowel sounds are normal. She exhibits no distension. There is no tenderness.  Musculoskeletal: She exhibits no edema.  Neurological: She is alert. She exhibits normal muscle tone. Coordination normal.  Skin: Skin is warm and dry. No rash noted. No erythema.  Nursing note and vitals reviewed.   ED Course  Procedures (including critical care time) Labs Review Labs Reviewed  BASIC METABOLIC PANEL - Abnormal; Notable for the following:    Glucose, Bld 103 (*)    BUN 23 (*)    Creatinine, Ser 1.12 (*)    GFR calc non Af Amer 40 (*)    GFR calc Af Amer 47 (*)    All  other components within normal limits  CBC WITH DIFFERENTIAL/PLATELET - Abnormal; Notable for the following:    Eosinophils Relative 13 (*)    All other components within normal limits  URINALYSIS, ROUTINE W REFLEX MICROSCOPIC (NOT AT [facility]) - Abnormal; Notable for the following:    Nitrite POSITIVE (*)    All other components within normal limits  URINE MICROSCOPIC-ADD ON - Abnormal; Notable for the following:    Bacteria, UA MANY (*)    All other components within normal limits  URINE CULTURE  TROPONIN I  I-STAT CG4 LACTIC ACID, ED    Imaging Review Dg Chest 2 View  03/03/2015    CLINICAL DATA:  Weakness.  EXAM: CHEST  2 VIEW  COMPARISON:  September 29, 2013.  FINDINGS: Stable cardiomegaly. No pneumothorax or pleural effusion is noted. Large hiatal hernia is noted. Status post coronary artery bypass graft. Stable right basilar scarring is noted. No acute pulmonary disease is noted.  IMPRESSION: Stable large hiatal hernia. Stable cardiomegaly. No acute cardiopulmonary abnormality seen.   Electronically Signed   By: Lupita RaiderJames  Green Jr, M.D.   On: 03/03/2015 21:30    Patient be treated for UTI and mild dehydration.  Patient is given IV fluids here in the emergency department.  The family declined CT scan of the head and will be advised follow-up with her primary care Dr. told to increase her fluid intake, rest as much as possible.  She is given IV Rocephin here in the emergency department and family is advised plan and all questions were answered  Charlestine NightChristopher Alleah Dearman, PA-C 03/04/15 0010  Lorre NickAnthony Allen, MD 03/05/15 2316

## 2015-03-04 NOTE — Discharge Instructions (Signed)
Follow-up with your primary care doctor.  Return here as needed.  This is most likely due to a urinary tract infection.  Increase your fluid intake

## 2015-03-05 LAB — URINE CULTURE

## 2015-07-14 ENCOUNTER — Encounter (HOSPITAL_COMMUNITY): Payer: Self-pay | Admitting: Emergency Medicine

## 2015-07-14 ENCOUNTER — Emergency Department (HOSPITAL_COMMUNITY)
Admission: EM | Admit: 2015-07-14 | Discharge: 2015-07-14 | Disposition: A | Discharge status: Home / Self Care | Payer: Medicare Other | Source: Home / Self Care | Attending: Emergency Medicine | Admitting: Emergency Medicine

## 2015-07-14 ENCOUNTER — Emergency Department (HOSPITAL_COMMUNITY): Payer: Medicare Other

## 2015-07-14 DIAGNOSIS — N390 Urinary tract infection, site not specified: Secondary | ICD-10-CM

## 2015-07-14 DIAGNOSIS — J189 Pneumonia, unspecified organism: Secondary | ICD-10-CM

## 2015-07-14 LAB — URINALYSIS, ROUTINE W REFLEX MICROSCOPIC
BILIRUBIN URINE: NEGATIVE
Glucose, UA: NEGATIVE mg/dL
Hgb urine dipstick: NEGATIVE
KETONES UR: NEGATIVE mg/dL
Leukocytes, UA: NEGATIVE
NITRITE: POSITIVE — AB
Protein, ur: NEGATIVE mg/dL
Specific Gravity, Urine: 1.015 (ref 1.005–1.030)
pH: 7 (ref 5.0–8.0)

## 2015-07-14 LAB — COMPREHENSIVE METABOLIC PANEL
ALT: 15 U/L (ref 14–54)
AST: 25 U/L (ref 15–41)
Albumin: 4 g/dL (ref 3.5–5.0)
Alkaline Phosphatase: 56 U/L (ref 38–126)
Anion gap: 11 (ref 5–15)
BUN: 16 mg/dL (ref 6–20)
CHLORIDE: 103 mmol/L (ref 101–111)
CO2: 24 mmol/L (ref 22–32)
Calcium: 9.2 mg/dL (ref 8.9–10.3)
Creatinine, Ser: 1.06 mg/dL — ABNORMAL HIGH (ref 0.44–1.00)
GFR calc Af Amer: 50 mL/min — ABNORMAL LOW (ref >60)
GFR calc non Af Amer: 43 mL/min — ABNORMAL LOW (ref >60)
Glucose, Bld: 125 mg/dL — ABNORMAL HIGH (ref 65–99)
POTASSIUM: 4.2 mmol/L (ref 3.5–5.1)
SODIUM: 138 mmol/L (ref 135–145)
Total Bilirubin: 1 mg/dL (ref 0.3–1.2)
Total Protein: 7 g/dL (ref 6.5–8.1)

## 2015-07-14 LAB — CBC WITH DIFFERENTIAL/PLATELET
BASOS ABS: 0 10*3/uL (ref 0.0–0.1)
BASOS PCT: 0 %
Eosinophils Absolute: 0.1 10*3/uL (ref 0.0–0.7)
Eosinophils Relative: 1 %
HCT: 42.5 % (ref 36.0–46.0)
Hemoglobin: 14.4 g/dL (ref 12.0–15.0)
Lymphocytes Relative: 10 %
Lymphs Abs: 0.8 10*3/uL (ref 0.7–4.0)
MCH: 31.8 pg (ref 26.0–34.0)
MCHC: 33.9 g/dL (ref 30.0–36.0)
MCV: 93.8 fL (ref 78.0–100.0)
Monocytes Absolute: 0.4 10*3/uL (ref 0.1–1.0)
Monocytes Relative: 5 %
Neutro Abs: 6.7 10*3/uL (ref 1.7–7.7)
Neutrophils Relative %: 84 %
PLATELETS: 198 10*3/uL (ref 150–400)
RBC: 4.53 MIL/uL (ref 3.87–5.11)
RDW: 13 % (ref 11.5–15.5)
WBC: 7.9 10*3/uL (ref 4.0–10.5)

## 2015-07-14 LAB — LIPASE, BLOOD: Lipase: 26 U/L (ref 11–51)

## 2015-07-14 LAB — URINE MICROSCOPIC-ADD ON

## 2015-07-14 LAB — I-STAT TROPONIN, ED: Troponin i, poc: 0.04 ng/mL (ref 0.00–0.08)

## 2015-07-14 MED ORDER — LEVOFLOXACIN 500 MG PO TABS
500.0000 mg | ORAL_TABLET | Freq: Once | ORAL | Status: AC
  Administered 2015-07-14: 500 mg via ORAL
  Filled 2015-07-14: qty 1

## 2015-07-14 MED ORDER — SODIUM CHLORIDE 0.9 % IV BOLUS (SEPSIS)
500.0000 mL | Freq: Once | INTRAVENOUS | Status: AC
  Administered 2015-07-14: 500 mL via INTRAVENOUS

## 2015-07-14 MED ORDER — ONDANSETRON HCL 4 MG/2ML IJ SOLN
4.0000 mg | Freq: Once | INTRAMUSCULAR | Status: AC
  Administered 2015-07-14: 4 mg via INTRAVENOUS
  Filled 2015-07-14: qty 2

## 2015-07-14 MED ORDER — LEVOFLOXACIN 250 MG PO TABS: 250.0000 mg | ORAL_TABLET | Freq: Every day | ORAL | Status: DC

## 2015-07-14 MED ORDER — ONDANSETRON 4 MG PO TBDP: ORAL_TABLET | ORAL | Status: DC

## 2015-07-14 NOTE — ED Notes (Signed)
Patient here from home  with complaints of weakness, nausea, vomiting since 9am this morning. Possible UTI?

## 2015-07-14 NOTE — ED Provider Notes (Signed)
CSN: [id number]     Arrival date & time 07/14/15  1332 History   First MD Initiated Contact with Patient 07/14/15 1514     Chief Complaint  Patient presents with  . Weakness  . Nausea  . Emesis     (Consider location/radiation/quality/duration/timing/severity/associated sxs/prior Treatment) The history is provided by the patient.  [name] is a 81 y.o. female hx of HTN, CHF, reflux here presenting with vomiting. She woke up this morning and had one episode of vomiting after eating breakfast. She feeling nauseated and is weak. Denies any fevers or chills or cough or abdominal pain or diarrhea. Denies any sick contacts at home. Family states that she had similar episode before and was diagnosed with urinary tract infection several months ago.   Level V caveat- dementia     Past Medical History  Diagnosis Date  . Carotid artery occlusion   . Hypertension   . CHF (congestive heart failure) (HCC)   . Thyroid disease     Hypothyroidism  . GERD (gastroesophageal reflux disease)   . Hiatal hernia   . Renal artery stenosis (HCC)   . CKD (chronic kidney disease)    Past Surgical History  Procedure Laterality Date  . Carotid endarterectomy  Feb. 22, 2010    Right cea  . Coronary artery bypass graft    . Abdominal hysterectomy  1993   No family history on file. Social History  Substance Use Topics  . Smoking status: Never Smoker   . Smokeless tobacco: Never Used  . Alcohol Use: No   OB History    No data available     Review of Systems  Gastrointestinal: Positive for nausea.  Neurological: Positive for weakness.  All other systems reviewed and are negative.     Allergies  Codeine; Morphine and related; and Lasix  Home Medications   Prior to Admission medications   Medication Sig Start Date End Date Taking? Authorizing Provider  amLODipine (NORVASC) 5 MG tablet Take 5 mg by mouth every evening.   Yes Historical Provider, MD  aspirin EC 81 MG tablet Take 81 mg  by mouth daily.   Yes Historical Provider, MD  levothyroxine (SYNTHROID, LEVOTHROID) 75 MCG tablet Take 75 mcg by mouth daily before breakfast.   Yes Historical Provider, MD  Pitavastatin Calcium (LIVALO) 2 MG TABS Take 2 mg by mouth every evening.   Yes Historical Provider, MD  cephALEXin (KEFLEX) 500 MG capsule Take 2 capsules (1,000 mg total) by mouth 2 (two) times daily. Patient not taking: Reported on 07/14/2015 03/04/15   Charlestine Night, PA-C   BP 165/99 mmHg  Pulse 70  Temp(Src) 97.6 F (36.4 C) (Oral)  Resp 14  SpO2 92% Physical Exam  Constitutional: She is oriented to person, place, and time.  Chronically ill, slightly dehydrated   HENT:  Head: Normocephalic.  MM slightly dry   Eyes: Conjunctivae are normal. Pupils are equal, round, and reactive to light.  Neck: Normal range of motion. Neck supple.  Cardiovascular: Normal rate, regular rhythm and normal heart sounds.   Pulmonary/Chest: Effort normal and breath sounds normal. No respiratory distress. She has no wheezes. She has no rales.  Abdominal: Soft. Bowel sounds are normal. She exhibits no distension. There is no tenderness. There is no rebound.  Musculoskeletal: Normal range of motion.  Neurological: She is alert and oriented to person, place, and time. No cranial nerve deficit. Coordination normal.  Skin: Skin is warm and dry.  Psychiatric: She has a normal  mood and affect. Her behavior is normal. Judgment and thought content normal.  Vitals reviewed.   ED Course  Procedures (including critical care time) Labs Review Labs Reviewed  COMPREHENSIVE METABOLIC PANEL - Abnormal; Notable for the following:    Glucose, Bld 125 (*)    Creatinine, Ser 1.06 (*)    GFR calc non Af Amer 43 (*)    GFR calc Af Amer 50 (*)    All other components within normal limits  URINALYSIS, ROUTINE W REFLEX MICROSCOPIC (NOT AT [facility]) - Abnormal; Notable for the following:    APPearance CLOUDY (*)    Nitrite POSITIVE (*)    All other  components within normal limits  URINE MICROSCOPIC-ADD ON - Abnormal; Notable for the following:    Squamous Epithelial / LPF 6-30 (*)    Bacteria, UA MANY (*)    All other components within normal limits  URINE CULTURE  CBC WITH DIFFERENTIAL/PLATELET  LIPASE, BLOOD  I-STAT TROPOININ, ED    Imaging Review Dg Abd Acute W/chest  07/14/2015  CLINICAL DATA:  Patient with nausea, vomiting weakness since 9 a.m. EXAM: DG ABDOMEN ACUTE W/ 1V CHEST COMPARISON:  Chest radiograph 03/03/2015 FINDINGS: Stable enlarged cardiac and mediastinal contours. Large air-filled hiatal hernia. Monitoring leads overlie the patient. Bandlike opacity within the right mid lung. No definite pleural effusion or pneumothorax. Supine evaluation of the abdomen demonstrates gas within nondilated loops of large and small bowel in a nonobstructed pattern. Scattered stool throughout the colon. Decubitus view demonstrates no evidence for free intraperitoneal air. Marked degenerative changes of the lumbar and thoracic spine. No aggressive or acute appearing osseous lesions. IMPRESSION: Bandlike opacification within the right mid lung is favored to represent atelectasis secondary to large hiatal hernia. Infection not excluded. Nonobstructed bowel gas pattern. No evidence for free intraperitoneal air. Electronically Signed   By: Annia Beltrew  Davis M.D.   On: 07/14/2015 16:32   I have personally reviewed and evaluated these images and lab results as part of my medical decision-making.   EKG Interpretation   Date/Time:  Friday July 14 2015 15:35:44 EST Ventricular Rate:  74 PR Interval:  161 QRS Duration: 110 QT Interval:  455 QTC Calculation: 505 R Axis:   78 Text Interpretation:  Sinus rhythm Ventricular premature complex Anterior  infarct, old Nonspecific T abnormalities, lateral leads No significant  change since last tracing Confirmed by YAO  MD, DAVID ([id number]) on  07/14/2015 3:57:09 PM      MDM   Final diagnoses:  None    [name] is a 81 y.o. female here with weakness, nausea. Likely gastro vs UTI vs pneumonia. Will check labs, UA, CXR. Will hydrate and reassess.    6:03 PM CXR showed possible pneumonia. UA + UTI. Previous culture pan sensitive. Given low dose levaquin, which she tolerated. Will dc home with zofran as well   Richardean Canalavid H Yao, MD 07/14/15 [phone]

## 2015-07-14 NOTE — Discharge Instructions (Signed)
Take zofran for nausea.   Stay hydrated.   Take levaquin daily for 7 days.   See your doctor.   Return to ER if you have vomiting, fevers, trouble breathing, abdominal pain.

## 2015-07-14 NOTE — ED Notes (Signed)
Bed: [id number] Expected date:  Expected time:  Means of arrival:  Comments: EMS- 81yo F, weakness/n/v/uti?

## 2015-07-15 ENCOUNTER — Inpatient Hospital Stay (HOSPITAL_COMMUNITY)
Admission: EM | Admit: 2015-07-15 | Discharge: 2015-07-17 | DRG: 392 | Disposition: A | Discharge status: Home / Self Care | Payer: Medicare Other | Attending: Internal Medicine | Admitting: Internal Medicine

## 2015-07-15 ENCOUNTER — Encounter (HOSPITAL_COMMUNITY): Payer: Self-pay

## 2015-07-15 ENCOUNTER — Emergency Department (HOSPITAL_COMMUNITY): Payer: Medicare Other

## 2015-07-15 DIAGNOSIS — J189 Pneumonia, unspecified organism: Secondary | ICD-10-CM

## 2015-07-15 DIAGNOSIS — K224 Dyskinesia of esophagus: Principal | ICD-10-CM | POA: Diagnosis present

## 2015-07-15 DIAGNOSIS — Z7982 Long term (current) use of aspirin: Secondary | ICD-10-CM

## 2015-07-15 DIAGNOSIS — I10 Essential (primary) hypertension: Secondary | ICD-10-CM | POA: Diagnosis present

## 2015-07-15 DIAGNOSIS — K219 Gastro-esophageal reflux disease without esophagitis: Secondary | ICD-10-CM | POA: Diagnosis present

## 2015-07-15 DIAGNOSIS — R1310 Dysphagia, unspecified: Secondary | ICD-10-CM

## 2015-07-15 DIAGNOSIS — R079 Chest pain, unspecified: Secondary | ICD-10-CM

## 2015-07-15 DIAGNOSIS — Z951 Presence of aortocoronary bypass graft: Secondary | ICD-10-CM

## 2015-07-15 DIAGNOSIS — N183 Chronic kidney disease, stage 3 unspecified: Secondary | ICD-10-CM

## 2015-07-15 DIAGNOSIS — E039 Hypothyroidism, unspecified: Secondary | ICD-10-CM | POA: Diagnosis present

## 2015-07-15 DIAGNOSIS — N390 Urinary tract infection, site not specified: Secondary | ICD-10-CM | POA: Diagnosis present

## 2015-07-15 DIAGNOSIS — I129 Hypertensive chronic kidney disease with stage 1 through stage 4 chronic kidney disease, or unspecified chronic kidney disease: Secondary | ICD-10-CM | POA: Diagnosis present

## 2015-07-15 DIAGNOSIS — K449 Diaphragmatic hernia without obstruction or gangrene: Secondary | ICD-10-CM | POA: Diagnosis present

## 2015-07-15 DIAGNOSIS — M549 Dorsalgia, unspecified: Secondary | ICD-10-CM

## 2015-07-15 DIAGNOSIS — R531 Weakness: Secondary | ICD-10-CM

## 2015-07-15 DIAGNOSIS — F0390 Unspecified dementia without behavioral disturbance: Secondary | ICD-10-CM | POA: Diagnosis present

## 2015-07-15 DIAGNOSIS — F0391 Unspecified dementia with behavioral disturbance: Secondary | ICD-10-CM | POA: Diagnosis not present

## 2015-07-15 DIAGNOSIS — E038 Other specified hypothyroidism: Secondary | ICD-10-CM | POA: Diagnosis not present

## 2015-07-15 LAB — URINALYSIS, ROUTINE W REFLEX MICROSCOPIC
Bilirubin Urine: NEGATIVE
Glucose, UA: NEGATIVE mg/dL
Hgb urine dipstick: NEGATIVE
Ketones, ur: NEGATIVE mg/dL
Leukocytes, UA: NEGATIVE
Nitrite: NEGATIVE
Protein, ur: NEGATIVE mg/dL
Specific Gravity, Urine: 1.024 (ref 1.005–1.030)
pH: 5 (ref 5.0–8.0)

## 2015-07-15 LAB — I-STAT TROPONIN, ED: TROPONIN I, POC: 0.02 ng/mL (ref 0.00–0.08)

## 2015-07-15 LAB — COMPREHENSIVE METABOLIC PANEL
ALK PHOS: 52 U/L (ref 38–126)
ALT: 14 U/L (ref 14–54)
AST: 25 U/L (ref 15–41)
Albumin: 3.2 g/dL — ABNORMAL LOW (ref 3.5–5.0)
Anion gap: 10 (ref 5–15)
BUN: 19 mg/dL (ref 6–20)
CALCIUM: 8.7 mg/dL — AB (ref 8.9–10.3)
CO2: 23 mmol/L (ref 22–32)
CREATININE: 1.23 mg/dL — AB (ref 0.44–1.00)
Chloride: 107 mmol/L (ref 101–111)
GFR calc non Af Amer: 36 mL/min — ABNORMAL LOW (ref >60)
GFR, EST AFRICAN AMERICAN: 42 mL/min — AB (ref >60)
GLUCOSE: 116 mg/dL — AB (ref 65–99)
Potassium: 3.9 mmol/L (ref 3.5–5.1)
SODIUM: 140 mmol/L (ref 135–145)
Total Bilirubin: 0.4 mg/dL (ref 0.3–1.2)
Total Protein: 6.2 g/dL — ABNORMAL LOW (ref 6.5–8.1)

## 2015-07-15 LAB — CBC
HCT: 40.3 % (ref 36.0–46.0)
Hemoglobin: 13.4 g/dL (ref 12.0–15.0)
MCH: 31.9 pg (ref 26.0–34.0)
MCHC: 33.3 g/dL (ref 30.0–36.0)
MCV: 96 fL (ref 78.0–100.0)
Platelets: ADEQUATE K/uL (ref 150–400)
RBC: 4.2 MIL/uL (ref 3.87–5.11)
RDW: 13.5 % (ref 11.5–15.5)
WBC: 7.2 K/uL (ref 4.0–10.5)

## 2015-07-15 LAB — BRAIN NATRIURETIC PEPTIDE: B Natriuretic Peptide: 204.8 pg/mL — ABNORMAL HIGH (ref 0.0–100.0)

## 2015-07-15 MED ORDER — SODIUM CHLORIDE 0.9 % IV BOLUS (SEPSIS)
500.0000 mL | Freq: Once | INTRAVENOUS | Status: AC
  Administered 2015-07-15: 500 mL via INTRAVENOUS

## 2015-07-15 MED ORDER — ACETAMINOPHEN 500 MG PO TABS
500.0000 mg | ORAL_TABLET | Freq: Four times a day (QID) | ORAL | Status: DC | PRN
  Filled 2015-07-15: qty 1

## 2015-07-15 MED ORDER — HEPARIN SODIUM (PORCINE) 5000 UNIT/ML IJ SOLN
5000.0000 [IU] | Freq: Three times a day (TID) | INTRAMUSCULAR | Status: DC
  Administered 2015-07-16 – 2015-07-17 (×3): 5000 [IU] via SUBCUTANEOUS
  Filled 2015-07-15 (×3): qty 1

## 2015-07-15 MED ORDER — FENTANYL CITRATE (PF) 100 MCG/2ML IJ SOLN
25.0000 ug | Freq: Once | INTRAMUSCULAR | Status: DC
  Filled 2015-07-15: qty 2

## 2015-07-15 MED ORDER — IOHEXOL 350 MG/ML SOLN
80.0000 mL | Freq: Once | INTRAVENOUS | Status: AC | PRN
  Administered 2015-07-15: 70 mL via INTRAVENOUS

## 2015-07-15 MED ORDER — LEVOTHYROXINE SODIUM 75 MCG PO TABS
75.0000 ug | ORAL_TABLET | Freq: Every day | ORAL | Status: DC
  Administered 2015-07-16 – 2015-07-17 (×2): 75 ug via ORAL
  Filled 2015-07-15 (×2): qty 1

## 2015-07-15 MED ORDER — AMLODIPINE BESYLATE 5 MG PO TABS
5.0000 mg | ORAL_TABLET | Freq: Every day | ORAL | Status: DC
  Administered 2015-07-16: 5 mg via ORAL
  Filled 2015-07-15: qty 1

## 2015-07-15 MED ORDER — ATORVASTATIN CALCIUM 20 MG PO TABS
20.0000 mg | ORAL_TABLET | ORAL | Status: DC
  Administered 2015-07-16: 20 mg via ORAL
  Filled 2015-07-15: qty 1

## 2015-07-15 MED ORDER — ONDANSETRON HCL 4 MG/2ML IJ SOLN
4.0000 mg | Freq: Once | INTRAMUSCULAR | Status: DC
  Filled 2015-07-15: qty 2

## 2015-07-15 MED ORDER — DEXTROSE 5 % IV SOLN
1.0000 g | Freq: Once | INTRAVENOUS | Status: AC
  Administered 2015-07-15: 1 g via INTRAVENOUS
  Filled 2015-07-15: qty 10

## 2015-07-15 MED ORDER — DEXTROSE 5 % IV SOLN: 500.0000 mg | Freq: Once | INTRAVENOUS | Status: DC

## 2015-07-15 MED ORDER — ASPIRIN EC 81 MG PO TBEC: 81.0000 mg | DELAYED_RELEASE_TABLET | Freq: Every day | ORAL | Status: DC

## 2015-07-15 MED ORDER — MECLIZINE HCL 25 MG PO TABS: 25.0000 mg | ORAL_TABLET | Freq: Three times a day (TID) | ORAL | Status: DC | PRN

## 2015-07-15 MED ORDER — LEVOFLOXACIN 250 MG PO TABS
250.0000 mg | ORAL_TABLET | Freq: Every day | ORAL | Status: DC
  Filled 2015-07-15 (×2): qty 1

## 2015-07-15 MED ORDER — DOXYCYCLINE HYCLATE 100 MG IV SOLR
100.0000 mg | Freq: Once | INTRAVENOUS | Status: AC
  Administered 2015-07-15: 100 mg via INTRAVENOUS
  Filled 2015-07-15: qty 100

## 2015-07-15 MED ORDER — ONDANSETRON 4 MG PO TBDP
4.0000 mg | ORAL_TABLET | Freq: Four times a day (QID) | ORAL | Status: DC | PRN
  Filled 2015-07-15: qty 1

## 2015-07-15 NOTE — ED Notes (Signed)
Patient transported to X-ray 

## 2015-07-15 NOTE — ED Notes (Signed)
Patient transported to CT 

## 2015-07-15 NOTE — H&P (Signed)
[facility] History and Physical  [name] ZOX:[id number] DOB: March 20, 1918 DOA: 07/15/2015  Referring physician: EDP PCP: Gwynneth Aliment, MD   Chief Complaint: Chest pain   HPI: [name] is a 81 y.o. female h/o CAD s/p CABG, remote h/o multiple esophageal dilation procedures years ago, hiatal hernia, dementia, presents to the ED with chest pain.  Pain is located in the center of her chest, radiates to her back.  Pain onset while eating dinner after she had a "choking" episode.  Choking episode is described as food getting stuck in her throat and unable to swallow it.  Daughter, who was with the patient at the time, notes that these episodes have become more frequent over the past "couple of weeks".  Tonight was the worst however.  Patient was clutching her chest during the episode, no coughing, and was able to talk without difficulty during the entire episode (food likely in esophagus, no respiratory tree involvement).  Review of Systems: Systems reviewed.  As above, otherwise negative  Past Medical History  Diagnosis Date  . Carotid artery occlusion   . Hypertension   . CHF (congestive heart failure) (HCC)   . Thyroid disease     Hypothyroidism  . GERD (gastroesophageal reflux disease)   . Hiatal hernia   . Renal artery stenosis (HCC)   . CKD (chronic kidney disease)    Past Surgical History  Procedure Laterality Date  . Carotid endarterectomy  Feb. 22, 2010    Right cea  . Coronary artery bypass graft    . Abdominal hysterectomy  1993   Social History:  reports that she has never smoked. She has never used smokeless tobacco. She reports that she does not drink alcohol or use illicit drugs.  Allergies  Allergen Reactions  . Codeine Other (See Comments)    Hallucination   . Morphine And Related Other (See Comments)    Hallucination  . Lasix [Furosemide] Nausea Only and Other (See Comments)    Cramps    No family history on file.   Prior to Admission  medications   Medication Sig Start Date End Date Taking? Authorizing Provider  acetaminophen (TYLENOL) 500 MG tablet Take 500 mg by mouth every 6 (six) hours as needed for headache (pain).   Yes Historical Provider, MD  amLODipine (NORVASC) 5 MG tablet Take 5 mg by mouth daily with supper.    Yes Historical Provider, MD  aspirin EC 81 MG tablet Take 81 mg by mouth daily.   Yes Historical Provider, MD  atorvastatin (LIPITOR) 20 MG tablet Take 20 mg by mouth See admin instructions. Take 1 tablet (20 mg) by mouth every other night after supper   Yes Historical Provider, MD  levofloxacin (LEVAQUIN) 250 MG tablet Take 1 tablet (250 mg total) by mouth daily. Patient taking differently: Take 250 mg by mouth daily after breakfast. 7 day course started 07/15/15 07/14/15  Yes Richardean Canal, MD  levothyroxine (SYNTHROID, LEVOTHROID) 75 MCG tablet Take 75 mcg by mouth daily before breakfast.   Yes Historical Provider, MD  meclizine (ANTIVERT) 25 MG tablet Take 25 mg by mouth 3 (three) times daily as needed for dizziness.   Yes Historical Provider, MD  ondansetron (ZOFRAN ODT) 4 MG disintegrating tablet 4mg  ODT q6 hours prn nausea/vomit Patient taking differently: Take 4 mg by mouth every 6 (six) hours as needed for nausea or vomiting. 4mg  ODT q6 hours prn nausea/vomit 07/14/15  Yes Richardean Canal, MD   Physical Exam: Ceasar Mons Vitals:  07/15/15 2330  BP: 178/85  Pulse: 72  Temp:   Resp: 16    BP 178/85 mmHg  Pulse 72  Temp(Src) 98.3 F (36.8 C) (Rectal)  Resp 16  SpO2 95%  General Appearance:    Alert, oriented, no distress, appears stated age  Head:    Normocephalic, atraumatic  Eyes:    PERRL, EOMI, sclera non-icteric        Nose:   Nares without drainage or epistaxis. Mucosa, turbinates normal  Throat:   Moist mucous membranes. Oropharynx without erythema or exudate.  Neck:   Supple. No carotid bruits.  No thyromegaly.  No lymphadenopathy.   Back:     No CVA tenderness, no spinal tenderness   Lungs:     Clear to auscultation bilaterally, without wheezes, rhonchi or rales  Chest wall:    No tenderness to palpitation  Heart:    Regular rate and rhythm without murmurs, gallops, rubs  Abdomen:     Soft, non-tender, nondistended, normal bowel sounds, no organomegaly  Genitalia:    deferred  Rectal:    deferred  Extremities:   No clubbing, cyanosis or edema.  Pulses:   2+ and symmetric all extremities  Skin:   Skin color, texture, turgor normal, no rashes or lesions  Lymph nodes:   Cervical, supraclavicular, and axillary nodes normal  Neurologic:   CNII-XII intact. Normal strength, sensation and reflexes      throughout    Labs on Admission:  Basic Metabolic Panel:  Recent Labs Lab 07/14/15 1558 07/15/15 2002  NA 138 140  K 4.2 3.9  CL 103 107  CO2 24 23  GLUCOSE 125* 116*  BUN 16 19  CREATININE 1.06* 1.23*  CALCIUM 9.2 8.7*   Liver Function Tests:  Recent Labs Lab 07/14/15 1558 07/15/15 2002  AST 25 25  ALT 15 14  ALKPHOS 56 52  BILITOT 1.0 0.4  PROT 7.0 6.2*  ALBUMIN 4.0 3.2*    Recent Labs Lab 07/14/15 1558  LIPASE 26   No results for input(s): AMMONIA in the last 168 hours. CBC:  Recent Labs Lab 07/14/15 1558 07/15/15 2002  WBC 7.9 7.2  NEUTROABS 6.7  --   HGB 14.4 13.4  HCT 42.5 40.3  MCV 93.8 96.0  PLT 198 PLATELET CLUMPS NOTED ON SMEAR, COUNT APPEARS ADEQUATE   Cardiac Enzymes: No results for input(s): CKTOTAL, CKMB, CKMBINDEX, TROPONINI in the last 168 hours.  BNP (last 3 results) No results for input(s): PROBNP in the last 8760 hours. CBG: No results for input(s): GLUCAP in the last 168 hours.  Radiological Exams on Admission: Dg Thoracic Spine 2 View  07/15/2015  CLINICAL DATA:  Acute thoracic back pain today. Possible fall. Weakness. Initial encounter. EXAM: THORACIC SPINE 2 VIEWS COMPARISON:  Chest CT on 03/11/2008 FINDINGS: Old T7 vertebral body compression fracture deformity is unchanged. No acute thoracic spine fracture  is seen. No evidence of subluxation. Generalized osteopenia noted. Large hiatal hernia again seen. IMPRESSION: No acute findings. Old T7 vertebral body compression fracture deformity. Electronically Signed   By: Myles Rosenthal M.D.   On: 07/15/2015 21:24   Dg Lumbar Spine Complete  07/15/2015  CLINICAL DATA:  Post fall with back pain. Weakness for several days. EXAM: LUMBAR SPINE - COMPLETE 4+ VIEW COMPARISON:  None. FINDINGS: No acute fracture. Mild dextroscoliotic curvature and advanced multilevel degenerative disc disease and facet arthropathy. Vertebral body heights are preserved. Multilevel disc space narrowing and endplate spurring. The bones are under mineralized. Atherosclerotic calcifications of  the aorta. IMPRESSION: Scoliosis and multilevel degenerative change without acute fracture of the lumbar spine. Electronically Signed   By: Rubye OaksMelanie  Ehinger M.D.   On: 07/15/2015 21:25   Dg Chest Portable 1 View  07/15/2015  CLINICAL DATA:  Chest pain. Hx HTN, CHF, and carotid artery occlusion. 07/14/2015 acute abdomen series EXAM: PORTABLE CHEST 1 VIEW COMPARISON:  None. FINDINGS: Midline trachea. Moderate cardiomegaly with prior median sternotomy. A large hiatal hernia. Possible small left pleural effusion. No pneumothorax. No gross congestive failure. Volume loss adjacent to the hernia in the right lung base. Especially compared to the 03/03/2015 exam, there is developing left base airspace disease. IMPRESSION: new left base airspace disease, suspicious for infection or aspiration. Large hiatal hernia. Cardiomegaly without overt congestive failure. Probable small left pleural effusion. Electronically Signed   By: Jeronimo GreavesKyle  Talbot M.D.   On: 07/15/2015 19:56   Dg Abd Acute W/chest  07/14/2015  CLINICAL DATA:  Patient with nausea, vomiting weakness since 9 a.m. EXAM: DG ABDOMEN ACUTE W/ 1V CHEST COMPARISON:  Chest radiograph 03/03/2015 FINDINGS: Stable enlarged cardiac and mediastinal contours. Large  air-filled hiatal hernia. Monitoring leads overlie the patient. Bandlike opacity within the right mid lung. No definite pleural effusion or pneumothorax. Supine evaluation of the abdomen demonstrates gas within nondilated loops of large and small bowel in a nonobstructed pattern. Scattered stool throughout the colon. Decubitus view demonstrates no evidence for free intraperitoneal air. Marked degenerative changes of the lumbar and thoracic spine. No aggressive or acute appearing osseous lesions. IMPRESSION: Bandlike opacification within the right mid lung is favored to represent atelectasis secondary to large hiatal hernia. Infection not excluded. Nonobstructed bowel gas pattern. No evidence for free intraperitoneal air. Electronically Signed   By: Annia Beltrew  Davis M.D.   On: 07/14/2015 16:32   Ct Angio Chest Aorta W/cm &/or Wo/cm  07/15/2015  CLINICAL DATA:  81 year old female with acute chest pain. Concern for dissection. EXAM: CT ANGIOGRAPHY CHEST, ABDOMEN AND PELVIS TECHNIQUE: Multidetector CT imaging through the chest, abdomen and pelvis was performed using the standard protocol during bolus administration of intravenous contrast. Noncontrast imaging of the chest obtained prior to contrast administration. Multiplanar reconstructed images and MIPs were obtained and reviewed to evaluate the vascular anatomy. CONTRAST:  70mL OMNIPAQUE IOHEXOL 350 MG/ML SOLN COMPARISON:  Radiographs earlier this day. CT abdomen 12/07/2013. Chest CT 03/11/2008 FINDINGS: CTA CHEST FINDINGS Normal caliber thoracic aorta without aneurysm or dissection. Mild ectasia of the ascending aorta. There is moderate calcified and noncalcified atheromatous plaque without acute aortic syndrome or aortic hematoma. Aorta is displaced laterally secondary to large hiatal hernia. No filling defects in the central pulmonary arteries. Heart is upper limits of normal in size. Coronary artery calcifications, patient is post CABG. No mediastinal or hilar  adenopathy. Small bilateral pleural effusions. No pericardial effusion. There are dependent opacities in the lung bases bilaterally. Scattered linear atelectasis throughout the right lung. Compressive atelectasis related to large hiatal hernia. Chronic large hiatal hernia, however increased portion of the stomach is super diaphragmatic compared to prior CT. The majority of the stomach is intrathoracic. No gastric wall thickening. The pylorus appears at the diaphragmatic hiatus. Review of the MIP images confirms the above findings. CTA ABDOMEN AND PELVIS FINDINGS Normal caliber abdominal aorta with moderate calcified and noncalcified atheromatous plaque. No aneurysm or dissection. 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 Moderate narrowing of the proximal superior mesenteric artery. The IMA is patent. Bilateral renal arteries are patent with narrowing at the origin, right greater  than left. Normal caliber iliac vessels. Arterial phase imaging of the liver demonstrates no focal abnormality. Multiple calcified gallstones within the gallbladder. No pericholecystic inflammatory change. Arterial phase imaging of the spleen and pancreas are normal for age. Small splenules anteriorly. Mild thickening of both adrenal glands without discrete nodule. Extrarenal pelvis configuration of the right kidney. Kidneys demonstrate symmetric enhancement. No perinephric stranding. No small or large bowel wall thickening. No bowel dilatation. No obstruction or inflammation. Multiple colonic diverticula without diverticulitis. No free air, free fluid, or intra-abdominal fluid collection. No adenopathy. Bladder is physiologically distended.  Uterus is surgically absent. Review of the osseous structures of the chest abdomen and pelvis demonstrate chronic compression deformity of T7. Advanced degenerative change throughout the lumbar spine. Advanced degenerative change at the pubic symphysis with chondrocalcinosis. There are no  acute or suspicious osseous abnormalities. Review of the MIP images confirms the above findings. IMPRESSION: 1. Moderate atherosclerosis of the thoracoabdominal aorta without aneurysm, dissection, or acute aortic syndrome. 2. 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 Mild stenosis at the origin of both renal arteries. 3. Large hiatal hernia, however appears progressed from CT of 2015. The majority of the stomach is intrathoracic. 4. Dependent densities in both lung bases may reflect atelectasis, aspiration, or less likely pneumonia. Trace bilateral pleural effusions. Electronically Signed   By: Rubye Oaks M.D.   On: 07/15/2015 23:27   Ct Cta Abd/pel W/cm &/or W/o Cm  07/15/2015  CLINICAL DATA:  81 year old female with acute chest pain. Concern for dissection. EXAM: CT ANGIOGRAPHY CHEST, ABDOMEN AND PELVIS TECHNIQUE: Multidetector CT imaging through the chest, abdomen and pelvis was performed using the standard protocol during bolus administration of intravenous contrast. Noncontrast imaging of the chest obtained prior to contrast administration. Multiplanar reconstructed images and MIPs were obtained and reviewed to evaluate the vascular anatomy. CONTRAST:  70mL OMNIPAQUE IOHEXOL 350 MG/ML SOLN COMPARISON:  Radiographs earlier this day. CT abdomen 12/07/2013. Chest CT 03/11/2008 FINDINGS: CTA CHEST FINDINGS Normal caliber thoracic aorta without aneurysm or dissection. Mild ectasia of the ascending aorta. There is moderate calcified and noncalcified atheromatous plaque without acute aortic syndrome or aortic hematoma. Aorta is displaced laterally secondary to large hiatal hernia. No filling defects in the central pulmonary arteries. Heart is upper limits of normal in size. Coronary artery calcifications, patient is post CABG. No mediastinal or hilar adenopathy. Small bilateral pleural effusions. No pericardial effusion. There are dependent opacities in the lung bases bilaterally. Scattered linear  atelectasis throughout the right lung. Compressive atelectasis related to large hiatal hernia. Chronic large hiatal hernia, however increased portion of the stomach is super diaphragmatic compared to prior CT. The majority of the stomach is intrathoracic. No gastric wall thickening. The pylorus appears at the diaphragmatic hiatus. Review of the MIP images confirms the above findings. CTA ABDOMEN AND PELVIS FINDINGS Normal caliber abdominal aorta with moderate calcified and noncalcified atheromatous plaque. No aneurysm or dissection. 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 Moderate narrowing of the proximal superior mesenteric artery. The IMA is patent. Bilateral renal arteries are patent with narrowing at the origin, right greater than left. Normal caliber iliac vessels. Arterial phase imaging of the liver demonstrates no focal abnormality. Multiple calcified gallstones within the gallbladder. No pericholecystic inflammatory change. Arterial phase imaging of the spleen and pancreas are normal for age. Small splenules anteriorly. Mild thickening of both adrenal glands without discrete nodule. Extrarenal pelvis configuration of the right kidney. Kidneys demonstrate symmetric enhancement. No perinephric stranding. No small  or large bowel wall thickening. No bowel dilatation. No obstruction or inflammation. Multiple colonic diverticula without diverticulitis. No free air, free fluid, or intra-abdominal fluid collection. No adenopathy. Bladder is physiologically distended.  Uterus is surgically absent. Review of the osseous structures of the chest abdomen and pelvis demonstrate chronic compression deformity of T7. Advanced degenerative change throughout the lumbar spine. Advanced degenerative change at the pubic symphysis with chondrocalcinosis. There are no acute or suspicious osseous abnormalities. Review of the MIP images confirms the above findings. IMPRESSION: 1. Moderate atherosclerosis of the  thoracoabdominal aorta without aneurysm, dissection, or acute aortic syndrome. 2. 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 Mild stenosis at the origin of both renal arteries. 3. Large hiatal hernia, however appears progressed from CT of 2015. The majority of the stomach is intrathoracic. 4. Dependent densities in both lung bases may reflect atelectasis, aspiration, or less likely pneumonia. Trace bilateral pleural effusions. Electronically Signed   By: Rubye Oaks M.D.   On: 07/15/2015 23:27    EKG: Independently reviewed.  Assessment/Plan Principal Problem:   Dysphagia Active Problems:   HTN (hypertension)   1. Dysphagia - does not appear or sound like she is aspirating though at all: 1. Barium swallow study ordered 2. Clear liquid diet ordered for now 3. DDX includes esophageal strictures which she has a history of vs problem related to her worsening hiatal hernia (see CT scan) 4. IVF to prevent dehydration (especially as she just got contrast) 5. Repeat BMP in AM 6. Given history of CAD will go ahead and get serial trops and keep patient on tele monitor, but given HPI feel that GI tract issue is far more likely the cause of today's symptoms. 2. HTN - continue home meds 3. UTI (diagnosed yesterday in ED) - 1. Continue PO levaquin 2. Cultures still pending   Code Status: Full Code  Family Communication: Family at bedside Disposition Plan: Admit to inpatient   Time spent: 70 min  GARDNER, JARED M. [facility] Pager [phone]  If 7AM-7PM, please contact the day team taking care of the patient [email or web address] Password [email or web address] 07/15/2015, 11:57 PM

## 2015-07-15 NOTE — ED Notes (Signed)
Per EMS had onset of chest pain this evening radiating to back at 10/10; EMS states family says some nausea; Pt had 1 nitro and 324 ASA in route; pt has hx of cabbage and MI; pt has dementia; pt presents with groans of pain

## 2015-07-15 NOTE — ED Provider Notes (Signed)
CSN: [id number]     Arrival date & time 07/15/15  1905 History   First MD Initiated Contact with Patient 07/15/15 1915     Chief Complaint  Patient presents with  . Chest Pain     (Consider location/radiation/quality/duration/timing/severity/associated sxs/prior Treatment) HPI Comments: 81 year old female with generalized weakness, high blood pressure, hypothyroid, dementia history presents for recurrent visit to the emergency department. Patient has had general weakness, nausea and vomiting and had significant chest pain with back pain this evening. Patient had a low risk fall landing on her buttocks no head injury no loss consciousness. Currently no chest pain. No fevers or chills. History assisted by family. Chest pain did not radiate when ON its own.  Patient is a 81 y.o. female presenting with chest pain. The history is provided by the patient and medical records.  Chest Pain Associated symptoms: back pain, nausea and vomiting   Associated symptoms: no abdominal pain, no fever, no headache and no shortness of breath     Past Medical History  Diagnosis Date  . Carotid artery occlusion   . Hypertension   . CHF (congestive heart failure) (HCC)   . Thyroid disease     Hypothyroidism  . GERD (gastroesophageal reflux disease)   . Hiatal hernia   . Renal artery stenosis (HCC)   . CKD (chronic kidney disease)    Past Surgical History  Procedure Laterality Date  . Carotid endarterectomy  Feb. 22, 2010    Right cea  . Coronary artery bypass graft    . Abdominal hysterectomy  1993   No family history on file. Social History  Substance Use Topics  . Smoking status: Never Smoker   . Smokeless tobacco: Never Used  . Alcohol Use: No   OB History    No data available     Review of Systems  Constitutional: Positive for appetite change. Negative for fever and chills.  HENT: Negative for congestion.   Eyes: Negative for visual disturbance.  Respiratory: Negative for shortness  of breath.   Cardiovascular: Positive for chest pain.  Gastrointestinal: Positive for nausea and vomiting. Negative for abdominal pain.  Genitourinary: Negative for dysuria and flank pain.  Musculoskeletal: Positive for back pain. Negative for neck pain and neck stiffness.  Skin: Negative for rash.  Neurological: Negative for light-headedness and headaches.      Allergies  Codeine; Morphine and related; and Lasix  Home Medications   Prior to Admission medications   Medication Sig Start Date End Date Taking? Authorizing Provider  acetaminophen (TYLENOL) 500 MG tablet Take 500 mg by mouth every 6 (six) hours as needed for headache (pain).   Yes Historical Provider, MD  amLODipine (NORVASC) 5 MG tablet Take 5 mg by mouth daily with supper.    Yes Historical Provider, MD  aspirin EC 81 MG tablet Take 81 mg by mouth daily.   Yes Historical Provider, MD  atorvastatin (LIPITOR) 20 MG tablet Take 20 mg by mouth See admin instructions. Take 1 tablet (20 mg) by mouth every other night after supper   Yes Historical Provider, MD  levofloxacin (LEVAQUIN) 250 MG tablet Take 1 tablet (250 mg total) by mouth daily. Patient taking differently: Take 250 mg by mouth daily after breakfast. 7 day course started 07/15/15 07/14/15  Yes Richardean Canal, MD  levothyroxine (SYNTHROID, LEVOTHROID) 75 MCG tablet Take 75 mcg by mouth daily before breakfast.   Yes Historical Provider, MD  meclizine (ANTIVERT) 25 MG tablet Take 25 mg by mouth 3 (three)  times daily as needed for dizziness.   Yes Historical Provider, MD  ondansetron (ZOFRAN ODT) 4 MG disintegrating tablet  ODT q6 hours prn nausea/vomit Patient taking differently: Take 4 mg by mouth every 6 (six) hours as needed for nausea or vomiting.  ODT q6 hours prn nausea/vomit 07/14/15  Yes Richardean Canal, MD   BP 169/81 mmHg  Pulse 73  Temp(Src) 98.3 F (36.8 C) (Rectal)  Resp 20  SpO2 96% Physical Exam  Constitutional: She is oriented to person, place, and  time. She appears well-developed and well-nourished.  HENT:  Head: Normocephalic and atraumatic.  Mild dry mucous membranes  Eyes: Conjunctivae are normal. Right eye exhibits no discharge. Left eye exhibits no discharge.  Neck: Normal range of motion. Neck supple. No tracheal deviation present.  Cardiovascular: Normal rate and regular rhythm.   Pulmonary/Chest: Effort normal and breath sounds normal.  Abdominal: Soft. She exhibits no distension. There is no tenderness. There is no guarding.  Musculoskeletal: She exhibits no edema.  No midline cervical thoracic or lumbar tenderness neck supple  Neurological: She is alert and oriented to person, place, and time. GCS eye subscore is 4. GCS verbal subscore is 4. GCS motor subscore is 6.  Patient moves all extremities equal bilateral upper and lower extremities 5+ strength, mild dementia clinically, neck supple, extraocular muscle function intact.  Skin: Skin is warm. No rash noted.  Psychiatric: She has a normal mood and affect.  Nursing note and vitals reviewed.   ED Course  Procedures (including critical care time) Labs Review Labs Reviewed  COMPREHENSIVE METABOLIC PANEL - Abnormal; Notable for the following:    Glucose, Bld 116 (*)    Creatinine, Ser 1.23 (*)    Calcium 8.7 (*)    Total Protein 6.2 (*)    Albumin 3.2 (*)    GFR calc non Af Amer 36 (*)    GFR calc Af Amer 42 (*)    All other components within normal limits  BRAIN NATRIURETIC PEPTIDE - Abnormal; Notable for the following:    B Natriuretic Peptide 204.8 (*)    All other components within normal limits  CBC  URINALYSIS, ROUTINE W REFLEX MICROSCOPIC (NOT AT [facility])  Rosezena Sensor, ED    Imaging Review Dg Thoracic Spine 2 View  07/15/2015  CLINICAL DATA:  Acute thoracic back pain today. Possible fall. Weakness. Initial encounter. EXAM: THORACIC SPINE 2 VIEWS COMPARISON:  Chest CT on 03/11/2008 FINDINGS: Old T7 vertebral body compression fracture deformity is  unchanged. No acute thoracic spine fracture is seen. No evidence of subluxation. Generalized osteopenia noted. Large hiatal hernia again seen. IMPRESSION: No acute findings. Old T7 vertebral body compression fracture deformity. Electronically Signed   By: Myles Rosenthal M.D.   On: 07/15/2015 21:24   Dg Lumbar Spine Complete  07/15/2015  CLINICAL DATA:  Post fall with back pain. Weakness for several days. EXAM: LUMBAR SPINE - COMPLETE 4+ VIEW COMPARISON:  None. FINDINGS: No acute fracture. Mild dextroscoliotic curvature and advanced multilevel degenerative disc disease and facet arthropathy. Vertebral body heights are preserved. Multilevel disc space narrowing and endplate spurring. The bones are under mineralized. Atherosclerotic calcifications of the aorta. IMPRESSION: Scoliosis and multilevel degenerative change without acute fracture of the lumbar spine. Electronically Signed   By: Rubye Oaks M.D.   On: 07/15/2015 21:25   Dg Chest Portable 1 View  07/15/2015  CLINICAL DATA:  Chest pain. Hx HTN, CHF, and carotid artery occlusion. 07/14/2015 acute abdomen series EXAM: PORTABLE CHEST 1 VIEW  COMPARISON:  None. FINDINGS: Midline trachea. Moderate cardiomegaly with prior median sternotomy. A large hiatal hernia. Possible small left pleural effusion. No pneumothorax. No gross congestive failure. Volume loss adjacent to the hernia in the right lung base. Especially compared to the 03/03/2015 exam, there is developing left base airspace disease. IMPRESSION: new left base airspace disease, suspicious for infection or aspiration. Large hiatal hernia. Cardiomegaly without overt congestive failure. Probable small left pleural effusion. Electronically Signed   By: Jeronimo Greaves M.D.   On: 07/15/2015 19:56   Dg Abd Acute W/chest  07/14/2015  CLINICAL DATA:  Patient with nausea, vomiting weakness since 9 a.m. EXAM: DG ABDOMEN ACUTE W/ 1V CHEST COMPARISON:  Chest radiograph 03/03/2015 FINDINGS: Stable enlarged  cardiac and mediastinal contours. Large air-filled hiatal hernia. Monitoring leads overlie the patient. Bandlike opacity within the right mid lung. No definite pleural effusion or pneumothorax. Supine evaluation of the abdomen demonstrates gas within nondilated loops of large and small bowel in a nonobstructed pattern. Scattered stool throughout the colon. Decubitus view demonstrates no evidence for free intraperitoneal air. Marked degenerative changes of the lumbar and thoracic spine. No aggressive or acute appearing osseous lesions. IMPRESSION: Bandlike opacification within the right mid lung is favored to represent atelectasis secondary to large hiatal hernia. Infection not excluded. Nonobstructed bowel gas pattern. No evidence for free intraperitoneal air. Electronically Signed   By: Annia Belt M.D.   On: 07/14/2015 16:32   I have personally reviewed and evaluated these images and lab results as part of my medical decision-making.   EKG Interpretation   Date/Time:  Saturday July 15 2015 19:21:38 EST Ventricular Rate:  84 PR Interval:  173 QRS Duration: 113 QT Interval:  424 QTC Calculation: 501 R Axis:   -46 Text Interpretation:  Sinus rhythm Multiple premature complexes, vent &  supraven Left ventricular hypertrophy Inferior infarct, old Anterior  infarct, old Prolonged QT interval overall similar previous Confirmed by  Cyntha Brickman  MD, Donathan Buller ([id number]) on 07/15/2015 7:33:09 PM Also confirmed by  Jodi Mourning  MD, Ashlynn Gunnels ([id number])  on 07/15/2015 8:03:36 PM      MDM   Final diagnoses:  CAP (community acquired pneumonia)  General weakness  Back pain, acute  Chest pain, unspecified chest pain type  Acute back pain  Acute chest pain   Patient presents with recurrent visit to the emergency department for worsening symptoms. With chest pain and back pain chest with triad hospitalist for dissection study, CT pending. Chest x-ray reviewed concerning for pneumonia. Clean acquired pneumonia  antibiotics given. [facility] hospitalist discussed with myself and patient in the ER and plan for admission. CT scan concerning for large hiatal hernia official results pending.  The patients results and plan were reviewed and discussed.   Any x-rays performed were independently reviewed by myself.   Differential diagnosis were considered with the presenting HPI.  Medications  fentaNYL (SUBLIMAZE) injection 25 mcg (not administered)  ondansetron (ZOFRAN) injection 4 mg (not administered)  cefTRIAXone (ROCEPHIN) 1 g in dextrose 5 % 50 mL IVPB (0 g Intravenous Stopped 07/15/15 2228)  sodium chloride 0.9 % bolus 500 mL (0 mLs Intravenous Stopped 07/15/15 2119)  doxycycline (VIBRAMYCIN) 100 mg in dextrose 5 % 250 mL IVPB (100 mg Intravenous New Bag/Given 07/15/15 2047)  iohexol (OMNIPAQUE) 350 MG/ML injection 80 mL (70 mLs Intravenous Contrast Given 07/15/15 2240)    Filed Vitals:   07/15/15 2000 07/15/15 2030 07/15/15 2146 07/15/15 2300  BP: 186/91 197/90  169/81  Pulse: 75 72  73  Temp:  98.3 F (36.8 C)   TempSrc:   Rectal   Resp: 26 24  20   SpO2: 89% 96%  96%    Final diagnoses:  CAP (community acquired pneumonia)  General weakness  Back pain, acute  Chest pain, unspecified chest pain type  Acute back pain  Acute chest pain    Admission/ observation were discussed with the admitting physician, patient and/or family and they are comfortable with the plan.     Blane OharaJoshua Reza Crymes, MD 07/15/15 [id number]

## 2015-07-16 DIAGNOSIS — F0390 Unspecified dementia without behavioral disturbance: Secondary | ICD-10-CM

## 2015-07-16 DIAGNOSIS — E038 Other specified hypothyroidism: Secondary | ICD-10-CM

## 2015-07-16 DIAGNOSIS — N183 Chronic kidney disease, stage 3 unspecified: Secondary | ICD-10-CM

## 2015-07-16 DIAGNOSIS — R079 Chest pain, unspecified: Secondary | ICD-10-CM

## 2015-07-16 LAB — TROPONIN I
TROPONIN I: 0.03 ng/mL (ref <0.031)
TROPONIN I: 0.03 ng/mL (ref <0.031)

## 2015-07-16 LAB — BASIC METABOLIC PANEL
ANION GAP: 8 (ref 5–15)
BUN: 16 mg/dL (ref 6–20)
CALCIUM: 8.5 mg/dL — AB (ref 8.9–10.3)
CHLORIDE: 106 mmol/L (ref 101–111)
CO2: 24 mmol/L (ref 22–32)
CREATININE: 1.13 mg/dL — AB (ref 0.44–1.00)
GFR calc non Af Amer: 40 mL/min — ABNORMAL LOW (ref >60)
GFR, EST AFRICAN AMERICAN: 46 mL/min — AB (ref >60)
Glucose, Bld: 119 mg/dL — ABNORMAL HIGH (ref 65–99)
Potassium: 4 mmol/L (ref 3.5–5.1)
SODIUM: 138 mmol/L (ref 135–145)

## 2015-07-16 MED ORDER — SODIUM CHLORIDE 0.9 % IV SOLN
INTRAVENOUS | Status: DC
  Administered 2015-07-16: 100 mL/h via INTRAVENOUS
  Administered 2015-07-16: 14:00:00 via INTRAVENOUS

## 2015-07-16 MED ORDER — DEXTROSE 5 % IV SOLN
1.0000 g | INTRAVENOUS | Status: DC
  Administered 2015-07-16: 1 g via INTRAVENOUS
  Filled 2015-07-16 (×3): qty 10

## 2015-07-16 MED ORDER — PANTOPRAZOLE SODIUM 40 MG PO TBEC
40.0000 mg | DELAYED_RELEASE_TABLET | Freq: Every day | ORAL | Status: DC
  Administered 2015-07-16 – 2015-07-17 (×2): 40 mg via ORAL
  Filled 2015-07-16 (×2): qty 1

## 2015-07-16 NOTE — Progress Notes (Signed)
Daughter in law is in the room taking patient to the bathroom and bathing her and has taken bed alarm off. Daughter in law states "I work a [employer] as a Psychologist, sport and exercisenurse tech; I will take her to the bathroom while I'm here." Daughter in law also placed bed alarm back on. Education given. Daughter in law verbalized understanding.

## 2015-07-16 NOTE — Progress Notes (Signed)
[facility] Admitted with chest pain. SR on telemetry. No complaints of pain on admission. Patient alert to self only. Has history of dementia. No skin break down noted. Lung sounds clear. 2L O2 via [place]. Daughters at bedside.

## 2015-07-16 NOTE — Progress Notes (Addendum)
[facility] Progress Note   [name]  KGM:[id number]  DOB: Oct 04, 1917  DOA: 07/15/2015 PCP: Gwynneth Aliment, MD  Brief narrative: [name] is a 81 y.o. female with CAD s/p CABG, esophageal dilatation many years ago, dementia, hiatal hernia who has had vomiting about 2 days ago. On the day of admission, she appeared to choke on a piece of food and once she was able to get it down, began to develop pain in the center of her chest which radiated to her back and made her break out into a sweat. EMS was called and she was given Aspirin. By then, her pain was resolving. She has been having episodes like this for a few weeks.    Subjective: No complaints- pleasantly confused  Assessment/Plan: Principal Problem:   Chest pain - 3 sets of troponin negative-  - suspect esophageal spasms- agree with esophagram - clear liquids for now  Active Problems: Large hiatal hernia - possibly causing GERD which is subsequently causing esophageal spasms - start PPI     HTN (hypertension) - cont Norvasc    Hypothyroid - cont synthroid    Dementia - no behavioral disturbances- family at bedside to assist her    Chronic kidney disease, stage 3 Stable  UTI? - today is the last day of Rocephin    Code Status:     Code Status Orders        Start     Ordered   07/16/15 1425  Do not attempt resuscitation (DNR)   Continuous    Question Answer Comment  In the event of cardiac or respiratory ARREST Do not call a "code blue"   In the event of cardiac or respiratory ARREST Do not perform Intubation, CPR, defibrillation or ACLS   In the event of cardiac or respiratory ARREST Use medication by any route, position, wound care, and other measures to relive pain and suffering. May use oxygen, suction and manual treatment of airway obstruction as needed for comfort.      07/16/15 1424    Advance Directive Documentation        Most Recent Value   Type of Advance Directive   Healthcare Power of Attorney, Living will [MD notified of HCPOA requesting DNR]   Pre-existing out of facility DNR order (yellow form or pink MOST form)     "MOST" Form in Place?       Family Communication: daughters at bedside Disposition Plan: home when stable DVT prophylaxis: Heparin Consultants: Procedures:   Antibiotics: Anti-infectives    Start     Dose/Rate Route Frequency Ordered Stop   07/16/15 2000  cefTRIAXone (ROCEPHIN) 1 g in dextrose 5 % 50 mL IVPB     1 g 100 mL/hr over 30 Minutes Intravenous Every 24 hours 07/16/15 0913     07/16/15 0900  levofloxacin (LEVAQUIN) tablet 250 mg  Status:  Discontinued     250 mg Oral Daily after breakfast 07/15/15 2354 07/16/15 0911   07/15/15 2015  cefTRIAXone (ROCEPHIN) 1 g in dextrose 5 % 50 mL IVPB     1 g 100 mL/hr over 30 Minutes Intravenous  Once 07/15/15 2004 07/15/15 2228   07/15/15 2015  azithromycin (ZITHROMAX) 500 mg in dextrose 5 % 250 mL IVPB  Status:  Discontinued     500 mg 250 mL/hr over 60 Minutes Intravenous  Once 07/15/15 2004 07/15/15 2008   07/15/15 2015  doxycycline (VIBRAMYCIN) 100 mg in dextrose 5 % 250 mL IVPB  100 mg 125 mL/hr over 120 Minutes Intravenous  Once 07/15/15 2008 07/15/15 2356      Objective: Filed Weights   07/16/15 0131  Weight: 52.708 kg (116 lb 3.2 oz)    Intake/Output Summary (Last 24 hours) at 07/16/15 1634 Last data filed at 07/16/15 1400  Gross per 24 hour  Intake   2140 ml  Output    675 ml  Net   1465 ml     Vitals Filed Vitals:   07/16/15 0100 07/16/15 0131 07/16/15 0426 07/16/15 1325  BP: 165/68 193/67 143/72 166/77  Pulse: 64 76 68 70  Temp:  97.6 F (36.4 C) 98.1 F (36.7 C) 98.2 F (36.8 C)  TempSrc:  Oral Oral Oral  Resp: 11 18 18 18   Height:  5' (1.524 m)    Weight:  52.708 kg (116 lb 3.2 oz)    SpO2: 93% 94% 94% 94%    Exam:  General:  Pt is alert, confused, not in acute distress  HEENT: No icterus, No thrush, oral mucosa moist  Cardiovascular:  regular rate and rhythm, S1/S2 No murmur  Respiratory: clear to auscultation bilaterally   Abdomen: Soft, +Bowel sounds, non tender, non distended, no guarding  MSK: No LE edema, cyanosis or clubbing  Data Reviewed: Basic Metabolic Panel:  Recent Labs Lab 07/14/15 1558 07/15/15 2002 07/16/15 0220  NA 138 140 138  K 4.2 3.9 4.0  CL 103 107 106  CO2 24 23 24   GLUCOSE 125* 116* 119*  BUN 16 19 16   CREATININE 1.06* 1.23* 1.13*  CALCIUM 9.2 8.7* 8.5*   Liver Function Tests:  Recent Labs Lab 07/14/15 1558 07/15/15 2002  AST 25 25  ALT 15 14  ALKPHOS 56 52  BILITOT 1.0 0.4  PROT 7.0 6.2*  ALBUMIN 4.0 3.2*    Recent Labs Lab 07/14/15 1558  LIPASE 26   No results for input(s): AMMONIA in the last 168 hours. CBC:  Recent Labs Lab 07/14/15 1558 07/15/15 2002  WBC 7.9 7.2  NEUTROABS 6.7  --   HGB 14.4 13.4  HCT 42.5 40.3  MCV 93.8 96.0  PLT 198 PLATELET CLUMPS NOTED ON SMEAR, COUNT APPEARS ADEQUATE   Cardiac Enzymes:  Recent Labs Lab 07/16/15 0037 07/16/15 0545 07/16/15 1210  TROPONINI <0.03 0.03 0.03   BNP (last 3 results)  Recent Labs  07/15/15 2002  BNP 204.8*    ProBNP (last 3 results) No results for input(s): PROBNP in the last 8760 hours.  CBG: No results for input(s): GLUCAP in the last 168 hours.  Recent Results (from the past 240 hour(s))  Urine culture     Status: None (Preliminary result)   Collection Time: 07/14/15  3:39 PM  Result Value Ref Range Status   Specimen Description URINE, CLEAN CATCH  Final   Special Requests NONE  Final   Culture   Final    CULTURE REINCUBATED FOR BETTER GROWTH Performed at [facility]    Report Status PENDING  Incomplete     Studies: Dg Thoracic Spine 2 View  07/15/2015  CLINICAL DATA:  Acute thoracic back pain today. Possible fall. Weakness. Initial encounter. EXAM: THORACIC SPINE 2 VIEWS COMPARISON:  Chest CT on 03/11/2008 FINDINGS: Old T7 vertebral body compression fracture  deformity is unchanged. No acute thoracic spine fracture is seen. No evidence of subluxation. Generalized osteopenia noted. Large hiatal hernia again seen. IMPRESSION: No acute findings. Old T7 vertebral body compression fracture deformity. Electronically Signed   By: Alver Sorrow.D.  On: 07/15/2015 21:24   Dg Lumbar Spine Complete  07/15/2015  CLINICAL DATA:  Post fall with back pain. Weakness for several days. EXAM: LUMBAR SPINE - COMPLETE 4+ VIEW COMPARISON:  None. FINDINGS: No acute fracture. Mild dextroscoliotic curvature and advanced multilevel degenerative disc disease and facet arthropathy. Vertebral body heights are preserved. Multilevel disc space narrowing and endplate spurring. The bones are under mineralized. Atherosclerotic calcifications of the aorta. IMPRESSION: Scoliosis and multilevel degenerative change without acute fracture of the lumbar spine. Electronically Signed   By: Rubye Oaks M.D.   On: 07/15/2015 21:25   Dg Chest Portable 1 View  07/15/2015  CLINICAL DATA:  Chest pain. Hx HTN, CHF, and carotid artery occlusion. 07/14/2015 acute abdomen series EXAM: PORTABLE CHEST 1 VIEW COMPARISON:  None. FINDINGS: Midline trachea. Moderate cardiomegaly with prior median sternotomy. A large hiatal hernia. Possible small left pleural effusion. No pneumothorax. No gross congestive failure. Volume loss adjacent to the hernia in the right lung base. Especially compared to the 03/03/2015 exam, there is developing left base airspace disease. IMPRESSION: new left base airspace disease, suspicious for infection or aspiration. Large hiatal hernia. Cardiomegaly without overt congestive failure. Probable small left pleural effusion. Electronically Signed   By: Jeronimo Greaves M.D.   On: 07/15/2015 19:56   Ct Angio Chest Aorta W/cm &/or Wo/cm  07/15/2015  CLINICAL DATA:  81 year old female with acute chest pain. Concern for dissection. EXAM: CT ANGIOGRAPHY CHEST, ABDOMEN AND PELVIS TECHNIQUE:  Multidetector CT imaging through the chest, abdomen and pelvis was performed using the standard protocol during bolus administration of intravenous contrast. Noncontrast imaging of the chest obtained prior to contrast administration. Multiplanar reconstructed images and MIPs were obtained and reviewed to evaluate the vascular anatomy. CONTRAST:  70mL OMNIPAQUE IOHEXOL 350 MG/ML SOLN COMPARISON:  Radiographs earlier this day. CT abdomen 12/07/2013. Chest CT 03/11/2008 FINDINGS: CTA CHEST FINDINGS Normal caliber thoracic aorta without aneurysm or dissection. Mild ectasia of the ascending aorta. There is moderate calcified and noncalcified atheromatous plaque without acute aortic syndrome or aortic hematoma. Aorta is displaced laterally secondary to large hiatal hernia. No filling defects in the central pulmonary arteries. Heart is upper limits of normal in size. Coronary artery calcifications, patient is post CABG. No mediastinal or hilar adenopathy. Small bilateral pleural effusions. No pericardial effusion. There are dependent opacities in the lung bases bilaterally. Scattered linear atelectasis throughout the right lung. Compressive atelectasis related to large hiatal hernia. Chronic large hiatal hernia, however increased portion of the stomach is super diaphragmatic compared to prior CT. The majority of the stomach is intrathoracic. No gastric wall thickening. The pylorus appears at the diaphragmatic hiatus. Review of the MIP images confirms the above findings. CTA ABDOMEN AND PELVIS FINDINGS Normal caliber abdominal aorta with moderate calcified and noncalcified atheromatous plaque. No aneurysm or dissection. 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 Moderate narrowing of the proximal superior mesenteric artery. The IMA is patent. Bilateral renal arteries are patent with narrowing at the origin, right greater than left. Normal caliber iliac vessels. Arterial phase imaging of the liver demonstrates no  focal abnormality. Multiple calcified gallstones within the gallbladder. No pericholecystic inflammatory change. Arterial phase imaging of the spleen and pancreas are normal for age. Small splenules anteriorly. Mild thickening of both adrenal glands without discrete nodule. Extrarenal pelvis configuration of the right kidney. Kidneys demonstrate symmetric enhancement. No perinephric stranding. No small or large bowel wall thickening. No bowel dilatation. No obstruction or inflammation. Multiple colonic diverticula without diverticulitis. No free  air, free fluid, or intra-abdominal fluid collection. No adenopathy. Bladder is physiologically distended.  Uterus is surgically absent. Review of the osseous structures of the chest abdomen and pelvis demonstrate chronic compression deformity of T7. Advanced degenerative change throughout the lumbar spine. Advanced degenerative change at the pubic symphysis with chondrocalcinosis. There are no acute or suspicious osseous abnormalities. Review of the MIP images confirms the above findings. IMPRESSION: 1. Moderate atherosclerosis of the thoracoabdominal aorta without aneurysm, dissection, or acute aortic syndrome. 2. 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 Mild stenosis at the origin of both renal arteries. 3. Large hiatal hernia, however appears progressed from CT of 2015. The majority of the stomach is intrathoracic. 4. Dependent densities in both lung bases may reflect atelectasis, aspiration, or less likely pneumonia. Trace bilateral pleural effusions. Electronically Signed   By: Rubye Oaks M.D.   On: 07/15/2015 23:27   Ct Cta Abd/pel W/cm &/or W/o Cm  07/15/2015  CLINICAL DATA:  81 year old female with acute chest pain. Concern for dissection. EXAM: CT ANGIOGRAPHY CHEST, ABDOMEN AND PELVIS TECHNIQUE: Multidetector CT imaging through the chest, abdomen and pelvis was performed using the standard protocol during bolus administration of intravenous  contrast. Noncontrast imaging of the chest obtained prior to contrast administration. Multiplanar reconstructed images and MIPs were obtained and reviewed to evaluate the vascular anatomy. CONTRAST:  70mL OMNIPAQUE IOHEXOL 350 MG/ML SOLN COMPARISON:  Radiographs earlier this day. CT abdomen 12/07/2013. Chest CT 03/11/2008 FINDINGS: CTA CHEST FINDINGS Normal caliber thoracic aorta without aneurysm or dissection. Mild ectasia of the ascending aorta. There is moderate calcified and noncalcified atheromatous plaque without acute aortic syndrome or aortic hematoma. Aorta is displaced laterally secondary to large hiatal hernia. No filling defects in the central pulmonary arteries. Heart is upper limits of normal in size. Coronary artery calcifications, patient is post CABG. No mediastinal or hilar adenopathy. Small bilateral pleural effusions. No pericardial effusion. There are dependent opacities in the lung bases bilaterally. Scattered linear atelectasis throughout the right lung. Compressive atelectasis related to large hiatal hernia. Chronic large hiatal hernia, however increased portion of the stomach is super diaphragmatic compared to prior CT. The majority of the stomach is intrathoracic. No gastric wall thickening. The pylorus appears at the diaphragmatic hiatus. Review of the MIP images confirms the above findings. CTA ABDOMEN AND PELVIS FINDINGS Normal caliber abdominal aorta with moderate calcified and noncalcified atheromatous plaque. No aneurysm or dissection. 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 Moderate narrowing of the proximal superior mesenteric artery. The IMA is patent. Bilateral renal arteries are patent with narrowing at the origin, right greater than left. Normal caliber iliac vessels. Arterial phase imaging of the liver demonstrates no focal abnormality. Multiple calcified gallstones within the gallbladder. No pericholecystic inflammatory change. Arterial phase imaging of the spleen  and pancreas are normal for age. Small splenules anteriorly. Mild thickening of both adrenal glands without discrete nodule. Extrarenal pelvis configuration of the right kidney. Kidneys demonstrate symmetric enhancement. No perinephric stranding. No small or large bowel wall thickening. No bowel dilatation. No obstruction or inflammation. Multiple colonic diverticula without diverticulitis. No free air, free fluid, or intra-abdominal fluid collection. No adenopathy. Bladder is physiologically distended.  Uterus is surgically absent. Review of the osseous structures of the chest abdomen and pelvis demonstrate chronic compression deformity of T7. Advanced degenerative change throughout the lumbar spine. Advanced degenerative change at the pubic symphysis with chondrocalcinosis. There are no acute or suspicious osseous abnormalities. Review of the MIP images confirms the  above findings. IMPRESSION: 1. Moderate atherosclerosis of the thoracoabdominal aorta without aneurysm, dissection, or acute aortic syndrome. 2. 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 Mild stenosis at the origin of both renal arteries. 3. Large hiatal hernia, however appears progressed from CT of 2015. The majority of the stomach is intrathoracic. 4. Dependent densities in both lung bases may reflect atelectasis, aspiration, or less likely pneumonia. Trace bilateral pleural effusions. Electronically Signed   By: Rubye OaksMelanie  Ehinger M.D.   On: 07/15/2015 23:27    Scheduled Meds:  Scheduled Meds: . amLODipine  5 mg Oral Q supper  . atorvastatin  20 mg Oral Q48H  . cefTRIAXone (ROCEPHIN)  IV  1 g Intravenous Q24H  . fentaNYL (SUBLIMAZE) injection  25 mcg Intravenous Once  . heparin  5,000 Units Subcutaneous 3 times per day  . levothyroxine  75 mcg Oral QAC breakfast  . ondansetron (ZOFRAN) IV  4 mg Intravenous Once   Continuous Infusions: . sodium chloride 100 mL/hr at 07/16/15 1420    Time spent on care of this patient: 35  min   Gay Moncivais, MD 07/16/2015, 4:34 PM  LOS: 1 day   [facility] Office  [phone] Pager - Text Page per [email or web address] If 7PM-7AM, please contact night-coverage [email or web address]

## 2015-07-16 NOTE — ED Notes (Signed)
RN attempt to call report to floor; RN to call back  

## 2015-07-17 ENCOUNTER — Inpatient Hospital Stay (HOSPITAL_COMMUNITY): Payer: Medicare Other

## 2015-07-17 DIAGNOSIS — I10 Essential (primary) hypertension: Secondary | ICD-10-CM

## 2015-07-17 DIAGNOSIS — F0391 Unspecified dementia with behavioral disturbance: Secondary | ICD-10-CM

## 2015-07-17 LAB — URINE CULTURE

## 2015-07-17 MED ORDER — HALOPERIDOL LACTATE 5 MG/ML IJ SOLN: 1.0000 mg | Freq: Once | INTRAMUSCULAR | Status: DC

## 2015-07-17 MED ORDER — PANTOPRAZOLE SODIUM 40 MG PO TBEC: 40.0000 mg | DELAYED_RELEASE_TABLET | Freq: Every day | ORAL | Status: DC

## 2015-07-17 MED ORDER — PANTOPRAZOLE SODIUM 20 MG PO TBEC: 40.0000 mg | DELAYED_RELEASE_TABLET | Freq: Every day | ORAL | Status: DC

## 2015-07-17 MED ORDER — NITROGLYCERIN 0.3 MG SL SUBL
0.3000 mg | SUBLINGUAL_TABLET | SUBLINGUAL | Status: AC | PRN
Start: 2015-07-17 — End: ?

## 2015-07-17 MED ORDER — HALOPERIDOL LACTATE 5 MG/ML IJ SOLN
2.0000 mg | Freq: Once | INTRAMUSCULAR | Status: AC
  Administered 2015-07-17: 2 mg via INTRAVENOUS
  Filled 2015-07-17: qty 1

## 2015-07-17 MED ORDER — LORAZEPAM 0.5 MG PO TABS: 0.5000 mg | ORAL_TABLET | Freq: Three times a day (TID) | ORAL | Status: DC | PRN

## 2015-07-17 MED ORDER — PANTOPRAZOLE SODIUM 40 MG PO PACK: 40.0000 mg | PACK | Freq: Every day | ORAL | Status: DC

## 2015-07-17 NOTE — Discharge Summary (Signed)
Physician Discharge Summary  [name] VHQ:[id number] DOB: 08-06-1918 DOA: 07/15/2015  PCP: Gwynneth Aliment, MD  Admit date: 07/15/2015 Discharge date: 07/17/2015  Time spent: 60 minutes    Discharge Condition: stable    Discharge Diagnoses:  Principal Problem:   Chest pain Active Problems:   HTN (hypertension)   Hypothyroid   Dementia   Chronic kidney disease, stage 3   History of present illness:  [name] is a 81 y.o. female with CAD s/p CABG, esophageal dilatation many years ago, dementia, hiatal hernia who has had vomiting about 2 days ago. On the day of admission, she appeared to choke on a piece of food and once she was able to get it down, began to develop pain in the center of her chest which radiated to her back and made her break out into a sweat. EMS was called and she was given Aspirin. By then, her pain was resolving. She has been having episodes like this for a few weeks.   Hospital Course:  Principal Problem:  Chest pain - 3 sets of troponin negative-  - suspect esophageal spasms- agree with esophagram ordered by admitting doctor- this reveals some tertiary contraction, no stricture and large hiatal hernia containing most of the stomach- see report below - have added a daily PPI and PRN Nitro which should only be used if spasms are severe- discussed in detail with daughters at bedside  Active Problems: Large hiatal hernia - possibly causing GERD which is subsequently causing esophageal spasms - start PPI    HTN (hypertension) - cont Norvasc   Hypothyroid - cont synthroid   Dementia - no behavioral disturbances- family at bedside to assist her   Chronic kidney disease, stage 3 Stable  UTI? - initial UA appeared to be contaminated- urine culture not sent- has completed a 3 day course of antibiotics  Severe dementia - with behavioral disturbances- daughters asking for Ativan which they will administer PRN if agitation is severe- I  have ordered a low dose  Discharge Exam: Filed Weights   07/16/15 0131 07/17/15 0527  Weight: 52.708 kg (116 lb 3.2 oz) 49.714 kg (109 lb 9.6 oz)   Filed Vitals:   07/17/15 0527 07/17/15 1126  BP: 152/82 184/71  Pulse: 79 67  Temp: 98.7 F (37.1 C) 98.7 F (37.1 C)  Resp: 18 18    General: AAO x 3, no distress Cardiovascular: RRR, no murmurs  Respiratory: clear to auscultation bilaterally GI: soft, non-tender, non-distended, bowel sound positive  Discharge Instructions You were cared for by a hospitalist during your hospital stay. If you have any questions about your discharge medications or the care you received while you were in the hospital after you are discharged, you can call the unit and asked to speak with the hospitalist on call if the hospitalist that took care of you is not available. Once you are discharged, your primary care physician will handle any further medical issues. Please note that NO REFILLS for any discharge medications will be authorized once you are discharged, as it is imperative that you return to your primary care physician (or establish a relationship with a primary care physician if you do not have one) for your aftercare needs so that they can reassess your need for medications and monitor your lab values.  Discharge Instructions    Diet - low sodium heart healthy    Complete by:  As directed      Increase activity slowly    Complete by:  As directed             Medication List    STOP taking these medications        levofloxacin 250 MG tablet  Commonly known as:  LEVAQUIN      TAKE these medications        acetaminophen 500 MG tablet  Commonly known as:  TYLENOL  Take 500 mg by mouth every 6 (six) hours as needed for headache (pain).     amLODipine 5 MG tablet  Commonly known as:  NORVASC  Take 5 mg by mouth daily with supper.     aspirin EC 81 MG tablet  Take 81 mg by mouth daily.     atorvastatin 20 MG tablet  Commonly known  as:  LIPITOR  Take 20 mg by mouth See admin instructions. Take 1 tablet (20 mg) by mouth every other night after supper     levothyroxine 75 MCG tablet  Commonly known as:  SYNTHROID, LEVOTHROID  Take 75 mcg by mouth daily before breakfast.     LORazepam 0.5 MG tablet  Commonly known as:  ATIVAN  Take 1 tablet (0.5 mg total) by mouth every 8 (eight) hours as needed for anxiety (severe anxiety).     meclizine 25 MG tablet  Commonly known as:  ANTIVERT  Take 25 mg by mouth 3 (three) times daily as needed for dizziness.     nitroGLYCERIN 0.3 MG SL tablet  Commonly known as:  NITROSTAT  Place 1 tablet (0.3 mg total) under the tongue as needed for chest pain.     ondansetron 4 MG disintegrating tablet  Commonly known as:  ZOFRAN ODT  4mg  ODT q6 hours prn nausea/vomit     pantoprazole sodium 40 mg/20 mL Pack  Commonly known as:  PROTONIX  Take 20 mLs (40 mg total) by mouth daily.     pantoprazole 40 MG tablet  Commonly known as:  PROTONIX  Take 1 tablet (40 mg total) by mouth daily.       Allergies  Allergen Reactions  . Codeine Other (See Comments)    Hallucination   . Morphine And Related Other (See Comments)    Hallucination  . Lasix [Furosemide] Nausea Only and Other (See Comments)    Cramps      The results of significant diagnostics from this hospitalization (including imaging, microbiology, ancillary and laboratory) are listed below for reference.    Significant Diagnostic Studies: Dg Thoracic Spine 2 View  07/15/2015  CLINICAL DATA:  Acute thoracic back pain today. Possible fall. Weakness. Initial encounter. EXAM: THORACIC SPINE 2 VIEWS COMPARISON:  Chest CT on 03/11/2008 FINDINGS: Old T7 vertebral body compression fracture deformity is unchanged. No acute thoracic spine fracture is seen. No evidence of subluxation. Generalized osteopenia noted. Large hiatal hernia again seen. IMPRESSION: No acute findings. Old T7 vertebral body compression fracture deformity.  Electronically Signed   By: Myles Rosenthal M.D.   On: 07/15/2015 21:24   Dg Lumbar Spine Complete  07/15/2015  CLINICAL DATA:  Post fall with back pain. Weakness for several days. EXAM: LUMBAR SPINE - COMPLETE 4+ VIEW COMPARISON:  None. FINDINGS: No acute fracture. Mild dextroscoliotic curvature and advanced multilevel degenerative disc disease and facet arthropathy. Vertebral body heights are preserved. Multilevel disc space narrowing and endplate spurring. The bones are under mineralized. Atherosclerotic calcifications of the aorta. IMPRESSION: Scoliosis and multilevel degenerative change without acute fracture of the lumbar spine. Electronically Signed   By: Lujean Rave.D.  On: 07/15/2015 21:25   Dg Esophagus  07/17/2015  CLINICAL DATA:  Difficulty swallowing.  Large hiatal hernia. EXAM: ESOPHOGRAM/BARIUM SWALLOW with barium tablet TECHNIQUE: Single contrast examination was performed using thin barium and a 13 mm barium tablet. FLUOROSCOPY TIME:  Fluoroscopy Time:  1 minute 15 seconds COMPARISON:  CT scan dated 07/15/2015 FINDINGS: The patient ingested thin barium without difficulty with no aspiration or laryngeal penetration. There are good primary and secondary stripping waves. There are a few tertiary contractions in the esophagus. The patient swallowed a 13 mm barium tablet which passed from the mouth to the stomach with no delay. The majority of the stomach is above the diaphragm. The diaphragmatic hiatus is at the distal antrum. Contrast passes through the stomach and into the pylorus and duodenal bulb without delay. There is slight narrowing of the distal antrum at the diaphragmatic hiatus. There are several diverticula in the fourth portion of the duodenum adjacent to the ligament of Treitz. IMPRESSION: There are few tertiary contractions in the distal esophagus. No obstruction. Large hiatal hernia containing most of the stomach. However, contrast passes into the pylorus and duodenum without  delay. Electronically Signed   By: Francene Boyers M.D.   On: 07/17/2015 14:29   Dg Chest Portable 1 View  07/15/2015  CLINICAL DATA:  Chest pain. Hx HTN, CHF, and carotid artery occlusion. 07/14/2015 acute abdomen series EXAM: PORTABLE CHEST 1 VIEW COMPARISON:  None. FINDINGS: Midline trachea. Moderate cardiomegaly with prior median sternotomy. A large hiatal hernia. Possible small left pleural effusion. No pneumothorax. No gross congestive failure. Volume loss adjacent to the hernia in the right lung base. Especially compared to the 03/03/2015 exam, there is developing left base airspace disease. IMPRESSION: new left base airspace disease, suspicious for infection or aspiration. Large hiatal hernia. Cardiomegaly without overt congestive failure. Probable small left pleural effusion. Electronically Signed   By: Jeronimo Greaves M.D.   On: 07/15/2015 19:56   Dg Abd Acute W/chest  07/14/2015  CLINICAL DATA:  Patient with nausea, vomiting weakness since 9 a.m. EXAM: DG ABDOMEN ACUTE W/ 1V CHEST COMPARISON:  Chest radiograph 03/03/2015 FINDINGS: Stable enlarged cardiac and mediastinal contours. Large air-filled hiatal hernia. Monitoring leads overlie the patient. Bandlike opacity within the right mid lung. No definite pleural effusion or pneumothorax. Supine evaluation of the abdomen demonstrates gas within nondilated loops of large and small bowel in a nonobstructed pattern. Scattered stool throughout the colon. Decubitus view demonstrates no evidence for free intraperitoneal air. Marked degenerative changes of the lumbar and thoracic spine. No aggressive or acute appearing osseous lesions. IMPRESSION: Bandlike opacification within the right mid lung is favored to represent atelectasis secondary to large hiatal hernia. Infection not excluded. Nonobstructed bowel gas pattern. No evidence for free intraperitoneal air. Electronically Signed   By: Annia Belt M.D.   On: 07/14/2015 16:32   Ct Angio Chest Aorta W/cm  &/or Wo/cm  07/15/2015  CLINICAL DATA:  81 year old female with acute chest pain. Concern for dissection. EXAM: CT ANGIOGRAPHY CHEST, ABDOMEN AND PELVIS TECHNIQUE: Multidetector CT imaging through the chest, abdomen and pelvis was performed using the standard protocol during bolus administration of intravenous contrast. Noncontrast imaging of the chest obtained prior to contrast administration. Multiplanar reconstructed images and MIPs were obtained and reviewed to evaluate the vascular anatomy. CONTRAST:  70mL OMNIPAQUE IOHEXOL 350 MG/ML SOLN COMPARISON:  Radiographs earlier this day. CT abdomen 12/07/2013. Chest CT 03/11/2008 FINDINGS: CTA CHEST FINDINGS Normal caliber thoracic aorta without aneurysm or dissection. Mild ectasia of the  ascending aorta. There is moderate calcified and noncalcified atheromatous plaque without acute aortic syndrome or aortic hematoma. Aorta is displaced laterally secondary to large hiatal hernia. No filling defects in the central pulmonary arteries. Heart is upper limits of normal in size. Coronary artery calcifications, patient is post CABG. No mediastinal or hilar adenopathy. Small bilateral pleural effusions. No pericardial effusion. There are dependent opacities in the lung bases bilaterally. Scattered linear atelectasis throughout the right lung. Compressive atelectasis related to large hiatal hernia. Chronic large hiatal hernia, however increased portion of the stomach is super diaphragmatic compared to prior CT. The majority of the stomach is intrathoracic. No gastric wall thickening. The pylorus appears at the diaphragmatic hiatus. Review of the MIP images confirms the above findings. CTA ABDOMEN AND PELVIS FINDINGS Normal caliber abdominal aorta with moderate calcified and noncalcified atheromatous plaque. No aneurysm or dissection. 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 Moderate narrowing of the proximal superior mesenteric artery. The IMA is patent.  Bilateral renal arteries are patent with narrowing at the origin, right greater than left. Normal caliber iliac vessels. Arterial phase imaging of the liver demonstrates no focal abnormality. Multiple calcified gallstones within the gallbladder. No pericholecystic inflammatory change. Arterial phase imaging of the spleen and pancreas are normal for age. Small splenules anteriorly. Mild thickening of both adrenal glands without discrete nodule. Extrarenal pelvis configuration of the right kidney. Kidneys demonstrate symmetric enhancement. No perinephric stranding. No small or large bowel wall thickening. No bowel dilatation. No obstruction or inflammation. Multiple colonic diverticula without diverticulitis. No free air, free fluid, or intra-abdominal fluid collection. No adenopathy. Bladder is physiologically distended.  Uterus is surgically absent. Review of the osseous structures of the chest abdomen and pelvis demonstrate chronic compression deformity of T7. Advanced degenerative change throughout the lumbar spine. Advanced degenerative change at the pubic symphysis with chondrocalcinosis. There are no acute or suspicious osseous abnormalities. Review of the MIP images confirms the above findings. IMPRESSION: 1. Moderate atherosclerosis of the thoracoabdominal aorta without aneurysm, dissection, or acute aortic syndrome. 2. 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 Mild stenosis at the origin of both renal arteries. 3. Large hiatal hernia, however appears progressed from CT of 2015. The majority of the stomach is intrathoracic. 4. Dependent densities in both lung bases may reflect atelectasis, aspiration, or less likely pneumonia. Trace bilateral pleural effusions. Electronically Signed   By: Rubye Oaks M.D.   On: 07/15/2015 23:27   Ct Cta Abd/pel W/cm &/or W/o Cm  07/15/2015  CLINICAL DATA:  81 year old female with acute chest pain. Concern for dissection. EXAM: CT ANGIOGRAPHY CHEST, ABDOMEN  AND PELVIS TECHNIQUE: Multidetector CT imaging through the chest, abdomen and pelvis was performed using the standard protocol during bolus administration of intravenous contrast. Noncontrast imaging of the chest obtained prior to contrast administration. Multiplanar reconstructed images and MIPs were obtained and reviewed to evaluate the vascular anatomy. CONTRAST:  70mL OMNIPAQUE IOHEXOL 350 MG/ML SOLN COMPARISON:  Radiographs earlier this day. CT abdomen 12/07/2013. Chest CT 03/11/2008 FINDINGS: CTA CHEST FINDINGS Normal caliber thoracic aorta without aneurysm or dissection. Mild ectasia of the ascending aorta. There is moderate calcified and noncalcified atheromatous plaque without acute aortic syndrome or aortic hematoma. Aorta is displaced laterally secondary to large hiatal hernia. No filling defects in the central pulmonary arteries. Heart is upper limits of normal in size. Coronary artery calcifications, patient is post CABG. No mediastinal or hilar adenopathy. Small bilateral pleural effusions. No pericardial effusion. There are dependent opacities in  the lung bases bilaterally. Scattered linear atelectasis throughout the right lung. Compressive atelectasis related to large hiatal hernia. Chronic large hiatal hernia, however increased portion of the stomach is super diaphragmatic compared to prior CT. The majority of the stomach is intrathoracic. No gastric wall thickening. The pylorus appears at the diaphragmatic hiatus. Review of the MIP images confirms the above findings. CTA ABDOMEN AND PELVIS FINDINGS Normal caliber abdominal aorta with moderate calcified and noncalcified atheromatous plaque. No aneurysm or dissection. 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 Moderate narrowing of the proximal superior mesenteric artery. The IMA is patent. Bilateral renal arteries are patent with narrowing at the origin, right greater than left. Normal caliber iliac vessels. Arterial phase imaging of the  liver demonstrates no focal abnormality. Multiple calcified gallstones within the gallbladder. No pericholecystic inflammatory change. Arterial phase imaging of the spleen and pancreas are normal for age. Small splenules anteriorly. Mild thickening of both adrenal glands without discrete nodule. Extrarenal pelvis configuration of the right kidney. Kidneys demonstrate symmetric enhancement. No perinephric stranding. No small or large bowel wall thickening. No bowel dilatation. No obstruction or inflammation. Multiple colonic diverticula without diverticulitis. No free air, free fluid, or intra-abdominal fluid collection. No adenopathy. Bladder is physiologically distended.  Uterus is surgically absent. Review of the osseous structures of the chest abdomen and pelvis demonstrate chronic compression deformity of T7. Advanced degenerative change throughout the lumbar spine. Advanced degenerative change at the pubic symphysis with chondrocalcinosis. There are no acute or suspicious osseous abnormalities. Review of the MIP images confirms the above findings. IMPRESSION: 1. Moderate atherosclerosis of the thoracoabdominal aorta without aneurysm, dissection, or acute aortic syndrome. 2. 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 Mild stenosis at the origin of both renal arteries. 3. Large hiatal hernia, however appears progressed from CT of 2015. The majority of the stomach is intrathoracic. 4. Dependent densities in both lung bases may reflect atelectasis, aspiration, or less likely pneumonia. Trace bilateral pleural effusions. Electronically Signed   By: Rubye OaksMelanie  Ehinger M.D.   On: 07/15/2015 23:27    Microbiology: Recent Results (from the past 240 hour(s))  Urine culture     Status: None   Collection Time: 07/14/15  3:39 PM  Result Value Ref Range Status   Specimen Description URINE, CLEAN CATCH  Final   Special Requests NONE  Final   Culture   Final    MULTIPLE SPECIES PRESENT, SUGGEST  RECOLLECTION [facility]    Report Status 07/17/2015 FINAL  Final     Labs: Basic Metabolic Panel:  Recent Labs Lab 07/14/15 1558 07/15/15 2002 07/16/15 0220  NA 138 140 138  K 4.2 3.9 4.0  CL 103 107 106  CO2 24 23 24   GLUCOSE 125* 116* 119*  BUN 16 19 16   CREATININE 1.06* 1.23* 1.13*  CALCIUM 9.2 8.7* 8.5*   Liver Function Tests:  Recent Labs Lab 07/14/15 1558 07/15/15 2002  AST 25 25  ALT 15 14  ALKPHOS 56 52  BILITOT 1.0 0.4  PROT 7.0 6.2*  ALBUMIN 4.0 3.2*    Recent Labs Lab 07/14/15 1558  LIPASE 26   No results for input(s): AMMONIA in the last 168 hours. CBC:  Recent Labs Lab 07/14/15 1558 07/15/15 2002  WBC 7.9 7.2  NEUTROABS 6.7  --   HGB 14.4 13.4  HCT 42.5 40.3  MCV 93.8 96.0  PLT 198 PLATELET CLUMPS NOTED ON SMEAR, COUNT APPEARS ADEQUATE   Cardiac Enzymes:  Recent Labs  Lab 07/16/15 0037 07/16/15 0545 07/16/15 1210  TROPONINI <0.03 0.03 0.03   BNP: BNP (last 3 results)  Recent Labs  07/15/15 2002  BNP 204.8*    ProBNP (last 3 results) No results for input(s): PROBNP in the last 8760 hours.  CBG: No results for input(s): GLUCAP in the last 168 hours.     SignedCalvert Cantor, MD [facility] 07/17/2015, 4:13 PM

## 2015-07-17 NOTE — Progress Notes (Signed)
Patient, [name], was highly agitated, trying to pull out IV and get out of chair - daughters x 2 at bedside holding her hands. IV wrapped with gauze to protect site.  Glenna DurandShlondria, RN paged Dr. Butler Denmarkizwan and 1 time dose of Haldol 2 mg ordered and was given.  Patient reassured discharge will happen when doctor says she is ready.  Daughters remain at chairside and OK staying with their mother.

## 2015-07-17 NOTE — Progress Notes (Signed)
Daughter lives with patient at her home, she is independent of ADL's, no DME needed. Harriett Sineancy stated that they wanted information on finding an aide. Information given to daughter for a personal care service to assist patient with bathing. Abelino DerrickB Yarima Penman Hebrew Rehabilitation CenterRN,MHA,BSN [phone]

## 2015-07-17 NOTE — Care Management Note (Signed)
Case Management Note  Patient Details  Name: [name] MRN: [id number] Date of Birth: 07/01/1918  Subjective/Objective:                    Action/Plan:   Expected Discharge Date:  07/19/15               Expected Discharge Plan:     In-House Referral:     Discharge planning Services     Post Acute Care Choice:    Choice offered to:     DME Arranged:    DME Agency:     HH Arranged:    HH Agency:     Status of Service:     Medicare Important Message Given:    Date Medicare IM Given:    Medicare IM give by:    Date Additional Medicare IM Given:    Additional Medicare Important Message give by:     If discussed at Long Length of Stay Meetings, dates discussed:    Additional Comments:  Cherrie DistanceChandler, Dagmar Adcox L, RN 07/17/2015, 4:04 PM

## 2015-07-17 NOTE — Progress Notes (Signed)
Patient's daughter received discharge instructions; states understanding of discharge instructions. IV and telemetry removed. Prescriptions given to daughter. Patient left via wheelchair with all belongings.

## 2015-10-07 ENCOUNTER — Emergency Department (HOSPITAL_COMMUNITY): Payer: Medicare Other

## 2015-10-07 ENCOUNTER — Encounter (HOSPITAL_COMMUNITY): Payer: Self-pay | Admitting: Emergency Medicine

## 2015-10-07 ENCOUNTER — Emergency Department (HOSPITAL_COMMUNITY)
Admission: EM | Admit: 2015-10-07 | Discharge: 2015-10-07 | Disposition: A | Discharge status: Home / Self Care | Payer: Medicare Other | Attending: Emergency Medicine | Admitting: Emergency Medicine

## 2015-10-07 DIAGNOSIS — N189 Chronic kidney disease, unspecified: Secondary | ICD-10-CM | POA: Diagnosis not present

## 2015-10-07 DIAGNOSIS — Z79899 Other long term (current) drug therapy: Secondary | ICD-10-CM | POA: Diagnosis not present

## 2015-10-07 DIAGNOSIS — Z7982 Long term (current) use of aspirin: Secondary | ICD-10-CM | POA: Insufficient documentation

## 2015-10-07 DIAGNOSIS — E039 Hypothyroidism, unspecified: Secondary | ICD-10-CM | POA: Diagnosis not present

## 2015-10-07 DIAGNOSIS — Z951 Presence of aortocoronary bypass graft: Secondary | ICD-10-CM | POA: Diagnosis not present

## 2015-10-07 DIAGNOSIS — R0981 Nasal congestion: Secondary | ICD-10-CM | POA: Insufficient documentation

## 2015-10-07 DIAGNOSIS — I509 Heart failure, unspecified: Secondary | ICD-10-CM | POA: Diagnosis not present

## 2015-10-07 DIAGNOSIS — R05 Cough: Secondary | ICD-10-CM | POA: Diagnosis present

## 2015-10-07 DIAGNOSIS — R059 Cough, unspecified: Secondary | ICD-10-CM

## 2015-10-07 DIAGNOSIS — I129 Hypertensive chronic kidney disease with stage 1 through stage 4 chronic kidney disease, or unspecified chronic kidney disease: Secondary | ICD-10-CM | POA: Diagnosis not present

## 2015-10-07 DIAGNOSIS — K219 Gastro-esophageal reflux disease without esophagitis: Secondary | ICD-10-CM | POA: Diagnosis not present

## 2015-10-07 MED ORDER — GUAIFENESIN-DM 100-10 MG/5ML PO SYRP: 5.0000 mL | ORAL_SOLUTION | ORAL | Status: DC | PRN

## 2015-10-07 NOTE — ED Notes (Signed)
Provider called to bedside to clarify discharge prescriptions.

## 2015-10-07 NOTE — ED Provider Notes (Signed)
CSN: [id number]     Arrival date & time 10/07/15  1820 History   First MD Initiated Contact with Patient 10/07/15 1951     Chief Complaint  Patient presents with  . Cough  . URI    HPI   Franchon MURIAL BEAM is a 82 y.o. female with a PMH of HTN, CKD, CHF who presents to the ED with nasal congestion and cough for the past week. Her family members are present at bedside, and note she has had a nonproductive cough, worse during the day and improved at night. She has been taking OTC cough medication with no significant symptom relief. She denies fever, chills, chest pain, shortness of breath, abdominal pain, N/V.   Past Medical History  Diagnosis Date  . Carotid artery occlusion   . Hypertension   . CHF (congestive heart failure) (HCC)   . Thyroid disease     Hypothyroidism  . GERD (gastroesophageal reflux disease)   . Hiatal hernia   . Renal artery stenosis (HCC)   . CKD (chronic kidney disease)    Past Surgical History  Procedure Laterality Date  . Carotid endarterectomy  Feb. 22, 2010    Right cea  . Coronary artery bypass graft    . Abdominal hysterectomy  1993   No family history on file. Social History  Substance Use Topics  . Smoking status: Never Smoker   . Smokeless tobacco: Never Used  . Alcohol Use: No   OB History    No data available       Review of Systems  Constitutional: Negative for fever and chills.  HENT: Positive for congestion.   Respiratory: Positive for cough. Negative for shortness of breath.   Cardiovascular: Negative for chest pain.  Gastrointestinal: Negative for nausea, vomiting and abdominal pain.      Allergies  Codeine; Morphine and related; and Lasix  Home Medications   Prior to Admission medications   Medication Sig Start Date End Date Taking? Authorizing Provider  acetaminophen (TYLENOL) 500 MG tablet Take 500 mg by mouth every 6 (six) hours as needed for headache (pain).   Yes Historical Provider, MD  amLODipine (NORVASC) 5 MG  tablet Take 5 mg by mouth daily with supper.    Yes Historical Provider, MD  aspirin EC 81 MG tablet Take 81 mg by mouth every other day.    Yes Historical Provider, MD  atorvastatin (LIPITOR) 20 MG tablet Take 20 mg by mouth See admin instructions. Take 1 tablet (20 mg) by mouth every other night after supper   Yes Historical Provider, MD  levothyroxine (SYNTHROID, LEVOTHROID) 75 MCG tablet Take 75 mcg by mouth daily before breakfast.   Yes Historical Provider, MD  meclizine (ANTIVERT) 25 MG tablet Take 25 mg by mouth 3 (three) times daily as needed for dizziness.   Yes Historical Provider, MD  nitroGLYCERIN (NITROSTAT) 0.3 MG SL tablet Place 1 tablet (0.3 mg total) under the tongue as needed for chest pain. 07/17/15  Yes Calvert Cantor, MD  guaiFENesin-dextromethorphan (ROBITUSSIN DM) 100-10 MG/5ML syrup Take 5 mLs by mouth every 4 (four) hours as needed for cough. 10/07/15   Mady Gemma, PA-C  LORazepam (ATIVAN) 0.5 MG tablet Take 1 tablet (0.5 mg total) by mouth every 8 (eight) hours as needed for anxiety (severe anxiety). 07/17/15   Calvert Cantor, MD  ondansetron (ZOFRAN ODT) 4 MG disintegrating tablet  ODT q6 hours prn nausea/vomit Patient taking differently: Take 4 mg by mouth every 6 (six) hours as needed  for nausea or vomiting. 4mg  ODT q6 hours prn nausea/vomit 07/14/15   Richardean Canal, MD  pantoprazole (PROTONIX) 40 MG tablet Take 1 tablet (40 mg total) by mouth daily. 07/17/15   Calvert Cantor, MD  pantoprazole sodium (PROTONIX) 40 mg/20 mL PACK Take 20 mLs (40 mg total) by mouth daily. 07/17/15   Calvert Cantor, MD    BP 188/88 mmHg  Pulse 76  Temp(Src) 98.2 F (36.8 C) (Oral)  Resp 17  Wt 48.62 kg  SpO2 96% Physical Exam  Constitutional: She is oriented to person, place, and time. She appears well-developed and well-nourished. No distress.  HENT:  Head: Normocephalic and atraumatic.  Right Ear: External ear normal.  Left Ear: External ear normal.  Nose: Nose normal.   Mouth/Throat: Uvula is midline, oropharynx is clear and moist and mucous membranes are normal.  Eyes: Conjunctivae, EOM and lids are normal. Pupils are equal, round, and reactive to light. Right eye exhibits no discharge. Left eye exhibits no discharge. No scleral icterus.  Neck: Normal range of motion. Neck supple.  Cardiovascular: Normal rate, regular rhythm, normal heart sounds, intact distal pulses and normal pulses.   Pulmonary/Chest: Effort normal and breath sounds normal. No respiratory distress. She has no wheezes. She has no rales.  Decreased breath sounds to lower lobes bilaterally. No increased work of breathing or respiratory distress.  Abdominal: Soft. Normal appearance and bowel sounds are normal. She exhibits no distension and no mass. There is no tenderness. There is no rigidity, no rebound and no guarding.  Musculoskeletal: Normal range of motion. She exhibits no edema or tenderness.  Neurological: She is alert and oriented to person, place, and time. She has normal strength. No cranial nerve deficit or sensory deficit.  Skin: Skin is warm, dry and intact. No rash noted. She is not diaphoretic. No erythema. No pallor.  Psychiatric: She has a normal mood and affect. Her speech is normal and behavior is normal.  Nursing note and vitals reviewed.   ED Course  Procedures (including critical care time)  Labs Review Labs Reviewed - No data to display  Imaging Review Dg Chest 2 View  10/07/2015  CLINICAL DATA:  82 year old with 1 week history of cough, chest congestion and wheezing. EXAM: CHEST  2 VIEW COMPARISON:  07/15/2015 and earlier, including CTA chest 07/15/2015. FINDINGS: Prior sternotomy for CABG. Cardiac silhouette moderately to markedly enlarged, unchanged. Very large hiatal hernia, unchanged. Thoracic aorta atherosclerotic, unchanged. Chronic scar/atelectasis in the right lower lobe due to chronic compression by the large hiatal hernia. Stable chronic scar/bronchiectasis  in the left lower lobe. Lungs otherwise clear. Pulmonary vascularity normal. No pleural effusions. Compression fractures of T7 and T10, stat verbal at T10 new since November, 2016 but likely not acute. IMPRESSION: 1.  No acute cardiopulmonary disease. 2. Stable moderate to marked cardiomegaly without pulmonary edema. 3. Stable chronic scar/atelectasis in the right lower lobe and chronic scar/bronchiectasis in the left lower lobe. 4. Stable very large hiatal hernia. Electronically Signed   By: Hulan Saas M.D.   On: 10/07/2015 19:38   I have personally reviewed and evaluated these images as part of my medical decision-making.   EKG Interpretation None      MDM   Final diagnoses:  Cough    82 year old female presents with nasal congestion and cough. Denies fever, chills, chest pain, shortness of breath. Notes she has been around family members with similar symptoms. Patient is afebrile. Vital signs stable. No tachypnea or hypoxia. Chest x-ray negative for  acute cardiopulmonary disease. Patient is nontoxic and well-appearing, feel she is stable for discharge at this time. Will treat symptomatically with cough medicine. Return precautions discussed at length. Patient to follow-up with PCP. Patient and family members verbalizes their understanding and are in agreement with plan. Patient discussed with and seen by Dr. Deretha Emory.  BP 188/88 mmHg  Pulse 76  Temp(Src) 98.2 F (36.8 C) (Oral)  Resp 17  Wt 48.62 kg  SpO2 96%      Mady Gemma, [place] 10/07/15 2136

## 2015-10-07 NOTE — ED Provider Notes (Signed)
Medical screening examination/treatment/procedure(s) were conducted as a shared visit with non-physician practitioner(s) and myself.  I personally evaluated the patient during the encounter.   EKG Interpretation None      Results for orders placed or performed during the hospital encounter of 07/15/15  CBC  Result Value Ref Range   WBC 7.2 4.0 - 10.5 K/uL   RBC 4.20 3.87 - 5.11 MIL/uL   Hemoglobin 13.4 12.0 - 15.0 g/dL   HCT 78.2 95.6 - 21.3 %   MCV 96.0 78.0 - 100.0 fL   MCH 31.9 26.0 - 34.0 pg   MCHC 33.3 30.0 - 36.0 g/dL   RDW 08.6 57.8 - 46.9 %   Platelets  150 - 400 K/uL    PLATELET CLUMPS NOTED ON SMEAR, COUNT APPEARS ADEQUATE  Comprehensive metabolic panel  Result Value Ref Range   Sodium 140 135 - 145 mmol/L   Potassium 3.9 3.5 - 5.1 mmol/L   Chloride 107 101 - 111 mmol/L   CO2 23 22 - 32 mmol/L   Glucose, Bld 116 (H) 65 - 99 mg/dL   BUN 19 6 - 20 mg/dL   Creatinine, Ser 6.29 (H) 0.44 - 1.00 mg/dL   Calcium 8.7 (L) 8.9 - 10.3 mg/dL   Total Protein 6.2 (L) 6.5 - 8.1 g/dL   Albumin 3.2 (L) 3.5 - 5.0 g/dL   AST 25 15 - 41 U/L   ALT 14 14 - 54 U/L   Alkaline Phosphatase 52 38 - 126 U/L   Total Bilirubin 0.4 0.3 - 1.2 mg/dL   GFR calc non Af Amer 36 (L) >60 mL/min   GFR calc Af Amer 42 (L) >60 mL/min   Anion gap 10 5 - 15  Brain natriuretic peptide (order if patient c/o SOB ONLY)  Result Value Ref Range   B Natriuretic Peptide 204.8 (H) 0.0 - 100.0 pg/mL  Urinalysis, Routine w reflex microscopic (not at [facility])  Result Value Ref Range   Color, Urine YELLOW YELLOW   APPearance CLEAR CLEAR   Specific Gravity, Urine 1.024 1.005 - 1.030   pH 5.0 5.0 - 8.0   Glucose, UA NEGATIVE NEGATIVE mg/dL   Hgb urine dipstick NEGATIVE NEGATIVE   Bilirubin Urine NEGATIVE NEGATIVE   Ketones, ur NEGATIVE NEGATIVE mg/dL   Protein, ur NEGATIVE NEGATIVE mg/dL   Nitrite NEGATIVE NEGATIVE   Leukocytes, UA NEGATIVE NEGATIVE  Basic metabolic panel  Result Value Ref Range   Sodium 138  135 - 145 mmol/L   Potassium 4.0 3.5 - 5.1 mmol/L   Chloride 106 101 - 111 mmol/L   CO2 24 22 - 32 mmol/L   Glucose, Bld 119 (H) 65 - 99 mg/dL   BUN 16 6 - 20 mg/dL   Creatinine, Ser 5.28 (H) 0.44 - 1.00 mg/dL   Calcium 8.5 (L) 8.9 - 10.3 mg/dL   GFR calc non Af Amer 40 (L) >60 mL/min   GFR calc Af Amer 46 (L) >60 mL/min   Anion gap 8 5 - 15  Troponin I (q 6hr x 3)  Result Value Ref Range   Troponin I <0.03 <0.031 ng/mL  Troponin I (q 6hr x 3)  Result Value Ref Range   Troponin I 0.03 <0.031 ng/mL  Troponin I (q 6hr x 3)  Result Value Ref Range   Troponin I 0.03 <0.031 ng/mL  I-stat troponin, ED (not at [facility])  Result Value Ref Range   Troponin i, poc 0.02 0.00 - 0.08 ng/mL   Comment 3  Dg Chest 2 View  10/07/2015  CLINICAL DATA:  82 year old with 1 week history of cough, chest congestion and wheezing. EXAM: CHEST  2 VIEW COMPARISON:  07/15/2015 and earlier, including CTA chest 07/15/2015. FINDINGS: Prior sternotomy for CABG. Cardiac silhouette moderately to markedly enlarged, unchanged. Very large hiatal hernia, unchanged. Thoracic aorta atherosclerotic, unchanged. Chronic scar/atelectasis in the right lower lobe due to chronic compression by the large hiatal hernia. Stable chronic scar/bronchiectasis in the left lower lobe. Lungs otherwise clear. Pulmonary vascularity normal. No pleural effusions. Compression fractures of T7 and T10, stat verbal at T10 new since November, 2016 but likely not acute. IMPRESSION: 1.  No acute cardiopulmonary disease. 2. Stable moderate to marked cardiomegaly without pulmonary edema. 3. Stable chronic scar/atelectasis in the right lower lobe and chronic scar/bronchiectasis in the left lower lobe. 4. Stable very large hiatal hernia. Electronically Signed   By: Hulan Saas M.D.   On: 10/07/2015 19:38    Patient with one-week history of upper respiratory infection with nonproductive cough. Other family members with similar illness. Patient  not febrile nontoxic no acute distress. Room air saturations are 94% or better. Patient stable for discharge home with precautions family will watch her carefully to make sure she doesn't get worse. Would recommend symptomatically treatment with a cough suppressant and expectorant.  Vanetta Mulders, MD 10/07/15 2128

## 2015-10-07 NOTE — Discharge Instructions (Signed)
1. Medications: cough medicine, usual home medications 2. Treatment: rest, drink plenty of fluids 3. Follow Up: please followup with your primary doctor in 2-3 days for discussion of your diagnoses and further evaluation after today's visit; if you do not have a primary care doctor use the resource guide provided to find one; please return to the ER for high fever, shortness of breath, new or worsening symptoms   Cough, Adult A cough helps to clear your throat and lungs. A cough may last only 2-3 weeks (acute), or it may last longer than 8 weeks (chronic). Many different things can cause a cough. A cough may be a sign of an illness or another medical condition. HOME CARE  Pay attention to any changes in your cough.  Take medicines only as told by your doctor.  If you were prescribed an antibiotic medicine, take it as told by your doctor. Do not stop taking it even if you start to feel better.  Talk with your doctor before you try using a cough medicine.  Drink enough fluid to keep your pee (urine) clear or pale yellow.  If the air is dry, use a cold steam vaporizer or humidifier in your home.  Stay away from things that make you cough at work or at home.  If your cough is worse at night, try using extra pillows to raise your head up higher while you sleep.  Do not smoke, and try not to be around smoke. If you need help quitting, ask your doctor.  Do not have caffeine.  Do not drink alcohol.  Rest as needed. GET HELP IF:  You have new problems (symptoms).  You cough up yellow fluid (pus).  Your cough does not get better after 2-3 weeks, or your cough gets worse.  Medicine does not help your cough and you are not sleeping well.  You have pain that gets worse or pain that is not helped with medicine.  You have a fever.  You are losing weight and you do not know why.  You have night sweats. GET HELP RIGHT AWAY IF:  You cough up blood.  You have trouble  breathing.  Your heartbeat is very fast.   This information is not intended to replace advice given to you by your health care provider. Make sure you discuss any questions you have with your health care provider.   Document Released: 04/25/2011 Document Revised: 05/03/2015 Document Reviewed: 10/19/2014 Elsevier Interactive Patient Education [employer]

## 2015-10-07 NOTE — ED Notes (Signed)
Pt brought in by family with c/o cough and congestion x 1 week.

## 2015-10-10 ENCOUNTER — Emergency Department (HOSPITAL_COMMUNITY): Payer: Medicare Other

## 2015-10-10 ENCOUNTER — Encounter (HOSPITAL_COMMUNITY): Payer: Self-pay

## 2015-10-10 ENCOUNTER — Emergency Department (HOSPITAL_COMMUNITY)
Admission: EM | Admit: 2015-10-10 | Discharge: 2015-10-11 | Disposition: A | Discharge status: Home / Self Care | Payer: Medicare Other | Attending: Emergency Medicine | Admitting: Emergency Medicine

## 2015-10-10 DIAGNOSIS — M25572 Pain in left ankle and joints of left foot: Secondary | ICD-10-CM | POA: Diagnosis not present

## 2015-10-10 DIAGNOSIS — J159 Unspecified bacterial pneumonia: Secondary | ICD-10-CM | POA: Insufficient documentation

## 2015-10-10 DIAGNOSIS — R05 Cough: Secondary | ICD-10-CM | POA: Diagnosis present

## 2015-10-10 DIAGNOSIS — Z79899 Other long term (current) drug therapy: Secondary | ICD-10-CM | POA: Diagnosis not present

## 2015-10-10 DIAGNOSIS — Z8719 Personal history of other diseases of the digestive system: Secondary | ICD-10-CM | POA: Insufficient documentation

## 2015-10-10 DIAGNOSIS — Z951 Presence of aortocoronary bypass graft: Secondary | ICD-10-CM | POA: Insufficient documentation

## 2015-10-10 DIAGNOSIS — N189 Chronic kidney disease, unspecified: Secondary | ICD-10-CM | POA: Insufficient documentation

## 2015-10-10 DIAGNOSIS — Z7982 Long term (current) use of aspirin: Secondary | ICD-10-CM | POA: Diagnosis not present

## 2015-10-10 DIAGNOSIS — I509 Heart failure, unspecified: Secondary | ICD-10-CM | POA: Diagnosis not present

## 2015-10-10 DIAGNOSIS — E039 Hypothyroidism, unspecified: Secondary | ICD-10-CM | POA: Insufficient documentation

## 2015-10-10 DIAGNOSIS — I129 Hypertensive chronic kidney disease with stage 1 through stage 4 chronic kidney disease, or unspecified chronic kidney disease: Secondary | ICD-10-CM | POA: Diagnosis not present

## 2015-10-10 DIAGNOSIS — J189 Pneumonia, unspecified organism: Secondary | ICD-10-CM

## 2015-10-10 LAB — CBC WITH DIFFERENTIAL/PLATELET
BASOS PCT: 0 %
Basophils Absolute: 0 10*3/uL (ref 0.0–0.1)
EOS ABS: 0 10*3/uL (ref 0.0–0.7)
EOS PCT: 0 %
HCT: 37.6 % (ref 36.0–46.0)
Hemoglobin: 12.5 g/dL (ref 12.0–15.0)
Lymphocytes Relative: 11 %
Lymphs Abs: 0.9 10*3/uL (ref 0.7–4.0)
MCH: 30.6 pg (ref 26.0–34.0)
MCHC: 33.2 g/dL (ref 30.0–36.0)
MCV: 92.2 fL (ref 78.0–100.0)
MONO ABS: 1.2 10*3/uL — AB (ref 0.1–1.0)
Monocytes Relative: 14 %
Neutro Abs: 6.1 10*3/uL (ref 1.7–7.7)
Neutrophils Relative %: 75 %
PLATELETS: 227 10*3/uL (ref 150–400)
RBC: 4.08 MIL/uL (ref 3.87–5.11)
RDW: 13 % (ref 11.5–15.5)
WBC: 8.2 10*3/uL (ref 4.0–10.5)

## 2015-10-10 LAB — COMPREHENSIVE METABOLIC PANEL
ALK PHOS: 49 U/L (ref 38–126)
ALT: 13 U/L — AB (ref 14–54)
AST: 20 U/L (ref 15–41)
Albumin: 3 g/dL — ABNORMAL LOW (ref 3.5–5.0)
Anion gap: 11 (ref 5–15)
BILIRUBIN TOTAL: 1.1 mg/dL (ref 0.3–1.2)
BUN: 12 mg/dL (ref 6–20)
CALCIUM: 8.7 mg/dL — AB (ref 8.9–10.3)
CO2: 25 mmol/L (ref 22–32)
Chloride: 101 mmol/L (ref 101–111)
Creatinine, Ser: 1.13 mg/dL — ABNORMAL HIGH (ref 0.44–1.00)
GFR calc Af Amer: 46 mL/min — ABNORMAL LOW (ref >60)
GFR, EST NON AFRICAN AMERICAN: 40 mL/min — AB (ref >60)
Glucose, Bld: 116 mg/dL — ABNORMAL HIGH (ref 65–99)
POTASSIUM: 3.7 mmol/L (ref 3.5–5.1)
Sodium: 137 mmol/L (ref 135–145)
TOTAL PROTEIN: 6 g/dL — AB (ref 6.5–8.1)

## 2015-10-10 LAB — URIC ACID: URIC ACID, SERUM: 5.7 mg/dL (ref 2.3–6.6)

## 2015-10-10 MED ORDER — LEVOFLOXACIN 500 MG PO TABS: 500.0000 mg | ORAL_TABLET | Freq: Every day | ORAL | Status: AC

## 2015-10-10 MED ORDER — PREDNISONE 20 MG PO TABS
40.0000 mg | ORAL_TABLET | Freq: Once | ORAL | Status: AC
  Administered 2015-10-11: 40 mg via ORAL
  Filled 2015-10-10: qty 2

## 2015-10-10 MED ORDER — IPRATROPIUM-ALBUTEROL 0.5-2.5 (3) MG/3ML IN SOLN
3.0000 mL | Freq: Once | RESPIRATORY_TRACT | Status: AC
  Administered 2015-10-10: 3 mL via RESPIRATORY_TRACT
  Filled 2015-10-10: qty 3

## 2015-10-10 MED ORDER — LEVOFLOXACIN 500 MG PO TABS
500.0000 mg | ORAL_TABLET | Freq: Once | ORAL | Status: AC
  Administered 2015-10-11: 500 mg via ORAL
  Filled 2015-10-10 (×2): qty 1

## 2015-10-10 MED ORDER — PREDNISONE 20 MG PO TABS: 40.0000 mg | ORAL_TABLET | Freq: Every day | ORAL | Status: AC

## 2015-10-10 NOTE — ED Notes (Addendum)
Pt's daughters in room with pt now. Pt's daughters state that the pt has had a cough and cold sx for a week now and she was seen here Saturday night and had a chest x-ray which was negative. They stated that the pt has "gone down since Sunday" pt isn't drinking fluid like normal. Pt's daughter states that the pt's mental state is at baseline. Denies any problems with urination. Pt's daughter states that the pt hasn't been able to put any weight on her left foot since this afternoon and cried out in pain when they moved her.

## 2015-10-10 NOTE — ED Notes (Signed)
Per EMS, pt from home with complaint of nonproctive cough "for a few days." Also complaint of decreased oral intake(primarily fluid intake). Lung sounds clear with EMS and here. Pt has hx of alzheimers. Pt denies pain but stated "I think I had a fever" pt is afebrile here. Pt had Chest x-ray yesterday which was negative. Pt stable and NAD. Pt's o2 at home on room air was 92-93% which is abnormal for patient. Pt placed on 3L [place] with EMS.

## 2015-10-10 NOTE — ED Provider Notes (Signed)
CSN: [id number]     Arrival date & time 10/10/15  2039 History   First MD Initiated Contact with Patient 10/10/15 2053     Chief Complaint  Patient presents with  . Cough     (Consider location/radiation/quality/duration/timing/severity/associated sxs/prior Treatment) Patient is a 82 y.o. female presenting with cough. The history is provided by a relative. The history is limited by the condition of the patient (Dementia).  Cough She has had a nonproductive cough for the last 5 days. She was seen in the ED 3 days ago and discharged with guaifenesin-dextromethorphan. Cough is slightly improved since then, but she has been generally deteriorating. There is no known fever or chills or sweats. She is not eating or drinking and is getting weaker. Also, today, she was unable to put weight on her left foot and daughter is worried that it looks red and swollen. There is no history of trauma to that foot. There was a sick contact in the home with a cough prior to the patient's illness. She has received her influenza immunization.  Past Medical History  Diagnosis Date  . Carotid artery occlusion   . Hypertension   . CHF (congestive heart failure) (HCC)   . Thyroid disease     Hypothyroidism  . GERD (gastroesophageal reflux disease)   . Hiatal hernia   . Renal artery stenosis (HCC)   . CKD (chronic kidney disease)    Past Surgical History  Procedure Laterality Date  . Carotid endarterectomy  Feb. 22, 2010    Right cea  . Coronary artery bypass graft    . Abdominal hysterectomy  1993   History reviewed. No pertinent family history. Social History  Substance Use Topics  . Smoking status: Never Smoker   . Smokeless tobacco: Never Used  . Alcohol Use: No   OB History    No data available     Review of Systems  Unable to perform ROS: Dementia  Respiratory: Positive for cough.       Allergies  Codeine; Morphine and related; and Lasix  Home Medications   Prior to Admission  medications   Medication Sig Start Date End Date Taking? Authorizing Provider  acetaminophen (TYLENOL) 500 MG tablet Take 500 mg by mouth every 6 (six) hours as needed for headache (pain).   Yes Historical Provider, MD  amLODipine (NORVASC) 5 MG tablet Take 5 mg by mouth daily with supper.    Yes Historical Provider, MD  aspirin EC 81 MG tablet Take 81 mg by mouth every other day.    Yes Historical Provider, MD  atorvastatin (LIPITOR) 20 MG tablet Take 20 mg by mouth See admin instructions. Take 1 tablet (20 mg) by mouth every other night after supper   Yes Historical Provider, MD  levothyroxine (SYNTHROID, LEVOTHROID) 75 MCG tablet Take 75 mcg by mouth daily before breakfast.   Yes Historical Provider, MD  nitroGLYCERIN (NITROSTAT) 0.3 MG SL tablet Place 1 tablet (0.3 mg total) under the tongue as needed for chest pain. 07/17/15  Yes Calvert Cantor, MD  guaiFENesin-dextromethorphan (ROBITUSSIN DM) 100-10 MG/5ML syrup Take 5 mLs by mouth every 4 (four) hours as needed for cough. Patient not taking: Reported on 10/10/2015 10/07/15   Mady Gemma, PA-C  LORazepam (ATIVAN) 0.5 MG tablet Take 1 tablet (0.5 mg total) by mouth every 8 (eight) hours as needed for anxiety (severe anxiety). Patient not taking: Reported on 10/10/2015 07/17/15   Calvert Cantor, MD  ondansetron (ZOFRAN ODT) 4 MG disintegrating tablet  ODT  q6 hours prn nausea/vomit Patient not taking: Reported on 10/10/2015 07/14/15   Richardean Canal, MD  pantoprazole (PROTONIX) 40 MG tablet Take 1 tablet (40 mg total) by mouth daily. Patient not taking: Reported on 10/10/2015 07/17/15   Calvert Cantor, MD  pantoprazole sodium (PROTONIX) 40 mg/20 mL PACK Take 20 mLs (40 mg total) by mouth daily. Patient not taking: Reported on 10/10/2015 07/17/15   Calvert Cantor, MD   BP 181/87 mmHg  Pulse 91  Temp(Src) 98.8 F (37.1 C) (Oral)  Wt 107 lb (48.535 kg)  SpO2 96% Physical Exam  Nursing note and vitals reviewed.  82 year old female, resting  comfortably and in no acute distress. Vital signs are significant for hypertension. Oxygen saturation is 96%, which is normal. Head is normocephalic and atraumatic. PERRLA, EOMI. Oropharynx is clear. Neck is nontender and supple without adenopathy or JVD. Back is nontender and there is no CVA tenderness. Lungs are clear without rales, wheezes, or rhonchi. Harsh cough is present. Chest is nontender. Heart has regular rate and rhythm without murmur. Abdomen is soft, flat, nontender without masses or hepatosplenomegaly and peristalsis is normoactive. Extremities have no cyanosis or edema, full range of motion is present. Left ankle is mildly erythematous with small effusion present. There is warmth to palpation. There is marked tenderness to palpation - both medially and laterally - and with passive range of motion. Skin is warm and dry without rash. Neurologic: She is awake and alert but does not answer questions. She does respond appropriately to pain. Cranial nerves are intact, there are no motor or sensory deficits.  ED Course  Procedures (including critical care time) Labs Review Results for orders placed or performed during the hospital encounter of 10/10/15  Comprehensive metabolic panel  Result Value Ref Range   Sodium 137 135 - 145 mmol/L   Potassium 3.7 3.5 - 5.1 mmol/L   Chloride 101 101 - 111 mmol/L   CO2 25 22 - 32 mmol/L   Glucose, Bld 116 (H) 65 - 99 mg/dL   BUN 12 6 - 20 mg/dL   Creatinine, Ser 1.61 (H) 0.44 - 1.00 mg/dL   Calcium 8.7 (L) 8.9 - 10.3 mg/dL   Total Protein 6.0 (L) 6.5 - 8.1 g/dL   Albumin 3.0 (L) 3.5 - 5.0 g/dL   AST 20 15 - 41 U/L   ALT 13 (L) 14 - 54 U/L   Alkaline Phosphatase 49 38 - 126 U/L   Total Bilirubin 1.1 0.3 - 1.2 mg/dL   GFR calc non Af Amer 40 (L) >60 mL/min   GFR calc Af Amer 46 (L) >60 mL/min   Anion gap 11 5 - 15  CBC with Differential  Result Value Ref Range   WBC 8.2 4.0 - 10.5 K/uL   RBC 4.08 3.87 - 5.11 MIL/uL   Hemoglobin 12.5  12.0 - 15.0 g/dL   HCT 09.6 04.5 - 40.9 %   MCV 92.2 78.0 - 100.0 fL   MCH 30.6 26.0 - 34.0 pg   MCHC 33.2 30.0 - 36.0 g/dL   RDW 81.1 91.4 - 78.2 %   Platelets 227 150 - 400 K/uL   Neutrophils Relative % 75 %   Neutro Abs 6.1 1.7 - 7.7 K/uL   Lymphocytes Relative 11 %   Lymphs Abs 0.9 0.7 - 4.0 K/uL   Monocytes Relative 14 %   Monocytes Absolute 1.2 (H) 0.1 - 1.0 K/uL   Eosinophils Relative 0 %   Eosinophils Absolute 0.0 0.0 -  0.7 K/uL   Basophils Relative 0 %   Basophils Absolute 0.0 0.0 - 0.1 K/uL  Uric acid  Result Value Ref Range   Uric Acid, Serum 5.7 2.3 - 6.6 mg/dL   Imaging Review Dg Chest 2 View  10/10/2015  CLINICAL DATA:  Cough and cold symptoms for a week. Patient was seen here Saturday night and had a negative chest x-ray. Productive cough. EXAM: CHEST  2 VIEW COMPARISON:  10/07/2015 FINDINGS: Postoperative changes in the mediastinum. Cardiac enlargement. No pulmonary vascular congestion. Large esophageal hiatal hernia behind the heart. Suggestion of developing focal consolidation in the left lung base may indicate pneumonia given the symptoms. No blunting of costophrenic angles. No pneumothorax. Degenerative changes in the spine with mild anterior wedge deformity of a mid thoracic vertebral body. Degenerative changes in the shoulders. Calcified and tortuous aorta. IMPRESSION: Cardiac enlargement. Large esophageal hiatal hernia behind the heart. Probable developing consolidation in the left lung base. Electronically Signed   By: Burman Nieves M.D.   On: 10/10/2015 22:31   Dg Ankle Complete Left  10/10/2015  CLINICAL DATA:  82 year old female with left ankle pain. EXAM: LEFT ANKLE COMPLETE - 3+ VIEW COMPARISON:  Radiograph dated 10/02/2013 FINDINGS: There is no acute fracture or dislocation. The bones are osteopenic. The soft tissues are grossly unremarkable. IMPRESSION: No acute fracture or dislocation. Electronically Signed   By: Elgie Collard M.D.   On: 10/10/2015  22:32   I have personally reviewed and evaluated these images and lab results as part of my medical decision-making.   MDM   Final diagnoses:  Community acquired pneumonia  Pain in left ankle   Persistent cough. Chest x-ray will be repeated to rule out pneumonia. Pain in left ankle which is strongly suggestive of gout. X-ray will be obtained to rule out occult trauma. Uric acid level will be checked. Screening labs will be obtained. Old records are reviewed confirming ED visit 3 days ago with cough and a negative chest x-ray.  She had no relief with albuterol with ipratropium. Uric acid levels come back normal. Chest x-ray looks like left lower lobe pneumonia has developed. In spite of this, patient is afebrile with normal heart rate and respirations and good oxygen saturation and is nontoxic in appearance. I discussed findings with family members and decision is made to try her with oral antibiotics at home. I am still suspicious that her ankle pain his gout and will put her on a short course of prednisone. I'm concerned about using colchicine in a patient this age group, and also worried about use of NSAIDs. She is discharged with prescriptions for levofloxacin and prednisone and is to follow-up with PCP in 2 days.  Dione Booze, MD 10/10/15 2352

## 2015-10-10 NOTE — Discharge Instructions (Signed)
Community-Acquired Pneumonia, Adult °Pneumonia is an infection of the lungs. There are different types of pneumonia. One type can develop while a person is in a hospital. A different type, called community-acquired pneumonia, develops in people who are not, or have not recently been, in the hospital or other health care facility.  °CAUSES °Pneumonia may be caused by bacteria, viruses, or funguses. Community-acquired pneumonia is often caused by Streptococcus pneumonia bacteria. These bacteria are often passed from one person to another by breathing in droplets from the cough or sneeze of an infected person. °RISK FACTORS °The condition is more likely to develop in: °· People who have chronic diseases, such as chronic obstructive pulmonary disease (COPD), asthma, congestive heart failure, cystic fibrosis, diabetes, or kidney disease. °· People who have early-stage or late-stage HIV. °· People who have sickle cell disease. °· People who have had their spleen removed (splenectomy). °· People who have poor dental hygiene. °· People who have medical conditions that increase the risk of breathing in (aspirating) secretions their own mouth and nose.   °· People who have a weakened immune system (immunocompromised). °· People who smoke. °· People who travel to areas where pneumonia-causing germs commonly exist. °· People who are around animal habitats or animals that have pneumonia-causing germs, including birds, bats, rabbits, cats, and farm animals. °SYMPTOMS °Symptoms of this condition include: °· A dry cough. °· A wet (productive) cough. °· Fever. °· Sweating. °· Chest pain, especially when breathing deeply or coughing. °· Rapid breathing or difficulty breathing. °· Shortness of breath. °· Shaking chills. °· Fatigue. °· Muscle aches. °DIAGNOSIS °Your health care provider will take a medical history and perform a physical exam. You may also have other tests, including: °· Imaging studies of your chest, including  X-rays. °· Tests to check your blood oxygen level and other blood gases. °· Other tests on blood, mucus (sputum), fluid around your lungs (pleural fluid), and urine. °If your pneumonia is severe, other tests may be done to identify the specific cause of your illness. °TREATMENT °The type of treatment that you receive depends on many factors, such as the cause of your pneumonia, the medicines you take, and other medical conditions that you have. For most adults, treatment and recovery from pneumonia may occur at home. In some cases, treatment must happen in a hospital. Treatment may include: °· Antibiotic medicines, if the pneumonia was caused by bacteria. °· Antiviral medicines, if the pneumonia was caused by a virus. °· Medicines that are given by mouth or through an IV tube. °· Oxygen. °· Respiratory therapy. °Although rare, treating severe pneumonia may include: °· Mechanical ventilation. This is done if you are not breathing well on your own and you cannot maintain a safe blood oxygen level. °· Thoracentesis. This procedure removes fluid around one lung or both lungs to help you breathe better. °HOME CARE INSTRUCTIONS °· Take over-the-counter and prescription medicines only as told by your health care provider. °¨ Only take cough medicine if you are losing sleep. Understand that cough medicine can prevent your body's natural ability to remove mucus from your lungs. °¨ If you were prescribed an antibiotic medicine, take it as told by your health care provider. Do not stop taking the antibiotic even if you start to feel better. °· Sleep in a semi-upright position at night. Try sleeping in a reclining chair, or place a few pillows under your head. °· Do not use tobacco products, including cigarettes, chewing tobacco, and e-cigarettes. If you need help quitting, ask your health care provider. °· Drink enough water to keep your urine   clear or pale yellow. This will help to thin out mucus secretions in your  lungs. PREVENTION There are ways that you can decrease your risk of developing community-acquired pneumonia. Consider getting a pneumococcal vaccine if:  You are older than 80 years of age.  You are older than 80 years of age and are undergoing cancer treatment, have chronic lung disease, or have other medical conditions that affect your immune system. Ask your health care provider if this applies to you. There are different types and schedules of pneumococcal vaccines. Ask your health care provider which vaccination option is best for you. You may also prevent community-acquired pneumonia if you take these actions:  Get an influenza vaccine every year. Ask your health care provider which type of influenza vaccine is best for you.  Go to the dentist on a regular basis.  Wash your hands often. Use hand sanitizer if soap and water are not available. SEEK MEDICAL CARE IF:  You have a fever.  You are losing sleep because you cannot control your cough with cough medicine. SEEK IMMEDIATE MEDICAL CARE IF:  You have worsening shortness of breath.  You have increased chest pain.  Your sickness becomes worse, especially if you are an older adult or have a weakened immune system.  You cough up blood.   This information is not intended to replace advice given to you by your health care provider. Make sure you discuss any questions you have with your health care provider.   Document Released: 08/12/2005 Document Revised: 05/03/2015 Document Reviewed: 12/07/2014 Elsevier Interactive Patient Education 2016 [employer]  Gout Gout is an inflammatory arthritis caused by a buildup of uric acid crystals in the joints. Uric acid is a chemical that is normally present in the blood. When the level of uric acid in the blood is too high it can form crystals that deposit in your joints and tissues. This causes joint redness, soreness, and swelling (inflammation). Repeat attacks are common. Over time,  uric acid crystals can form into masses (tophi) near a joint, destroying bone and causing disfigurement. Gout is treatable and often preventable. CAUSES  The disease begins with elevated levels of uric acid in the blood. Uric acid is produced by your body when it breaks down a naturally found substance called purines. Certain foods you eat, such as meats and fish, contain high amounts of purines. Causes of an elevated uric acid level include:  Being passed down from parent to child (heredity).  Diseases that cause increased uric acid production (such as obesity, psoriasis, and certain cancers).  Excessive alcohol use.  Diet, especially diets rich in meat and seafood.  Medicines, including certain cancer-fighting medicines (chemotherapy), water pills (diuretics), and aspirin.  Chronic kidney disease. The kidneys are no longer able to remove uric acid well.  Problems with metabolism. Conditions strongly associated with gout include:  Obesity.  High blood pressure.  High cholesterol.  Diabetes. Not everyone with elevated uric acid levels gets gout. It is not understood why some people get gout and others do not. Surgery, joint injury, and eating too much of certain foods are some of the factors that can lead to gout attacks. SYMPTOMS   An attack of gout comes on quickly. It causes intense pain with redness, swelling, and warmth in a joint.  Fever can occur.  Often, only one joint is involved. Certain joints are more commonly involved:  Base of the big toe.  Knee.  Ankle.  Wrist.  Finger. Without treatment, an  attack usually goes away in a few days to weeks. Between attacks, you usually will not have symptoms, which is different from many other forms of arthritis. DIAGNOSIS  Your caregiver will suspect gout based on your symptoms and exam. In some cases, tests may be recommended. The tests may include:  Blood tests.  Urine tests.  X-rays.  Joint fluid exam. This exam  requires a needle to remove fluid from the joint (arthrocentesis). Using a microscope, gout is confirmed when uric acid crystals are seen in the joint fluid. TREATMENT  There are two phases to gout treatment: treating the sudden onset (acute) attack and preventing attacks (prophylaxis).  Treatment of an Acute Attack.  Medicines are used. These include anti-inflammatory medicines or steroid medicines.  An injection of steroid medicine into the affected joint is sometimes necessary.  The painful joint is rested. Movement can worsen the arthritis.  You may use warm or cold treatments on painful joints, depending which works best for you.  Treatment to Prevent Attacks.  If you suffer from frequent gout attacks, your caregiver may advise preventive medicine. These medicines are started after the acute attack subsides. These medicines either help your kidneys eliminate uric acid from your body or decrease your uric acid production. You may need to stay on these medicines for a very long time.  The early phase of treatment with preventive medicine can be associated with an increase in acute gout attacks. For this reason, during the first few months of treatment, your caregiver may also advise you to take medicines usually used for acute gout treatment. Be sure you understand your caregiver's directions. Your caregiver may make several adjustments to your medicine dose before these medicines are effective.  Discuss dietary treatment with your caregiver or dietitian. Alcohol and drinks high in sugar and fructose and foods such as meat, poultry, and seafood can increase uric acid levels. Your caregiver or dietitian can advise you on drinks and foods that should be limited. HOME CARE INSTRUCTIONS   Do not take aspirin to relieve pain. This raises uric acid levels.  Only take over-the-counter or prescription medicines for pain, discomfort, or fever as directed by your caregiver.  Rest the joint as  much as possible. When in bed, keep sheets and blankets off painful areas.  Keep the affected joint raised (elevated).  Apply warm or cold treatments to painful joints. Use of warm or cold treatments depends on which works best for you.  Use crutches if the painful joint is in your leg.  Drink enough fluids to keep your urine clear or pale yellow. This helps your body get rid of uric acid. Limit alcohol, sugary drinks, and fructose drinks.  Follow your dietary instructions. Pay careful attention to the amount of protein you eat. Your daily diet should emphasize fruits, vegetables, whole grains, and fat-free or low-fat milk products. Discuss the use of coffee, vitamin C, and cherries with your caregiver or dietitian. These may be helpful in lowering uric acid levels.  Maintain a healthy body weight. SEEK MEDICAL CARE IF:   You develop diarrhea, vomiting, or any side effects from medicines.  You do not feel better in 24 hours, or you are getting worse. SEEK IMMEDIATE MEDICAL CARE IF:   Your joint becomes suddenly more tender, and you have chills or a fever. MAKE SURE YOU:   Understand these instructions.  Will watch your condition.  Will get help right away if you are not doing well or get worse.   This  information is not intended to replace advice given to you by your health care provider. Make sure you discuss any questions you have with your health care provider.   Document Released: 08/09/2000 Document Revised: 09/02/2014 Document Reviewed: 03/25/2012 Elsevier Interactive Patient Education 2016 [employer]   Ankle Pain Ankle pain is a common symptom. The bones, cartilage, tendons, and muscles of the ankle joint perform a lot of work each day. The ankle joint holds your body weight and allows you to move around. Ankle pain can occur on either side or back of 1 or both ankles. Ankle pain may be sharp and burning or dull and aching. There may be tenderness, stiffness, redness,  or warmth around the ankle. The pain occurs more often when a person walks or puts pressure on the ankle. CAUSES  There are many reasons ankle pain can develop. It is important to work with your caregiver to identify the cause since many conditions can impact the bones, cartilage, muscles, and tendons. Causes for ankle pain include:  Injury, including a break (fracture), sprain, or strain often due to a fall, sports, or a high-impact activity.  Swelling (inflammation) of a tendon (tendonitis).  Achilles tendon rupture.  Ankle instability after repeated sprains and strains.  Poor foot alignment.  Pressure on a nerve (tarsal tunnel syndrome).  Arthritis in the ankle or the lining of the ankle.  Crystal formation in the ankle (gout or pseudogout). DIAGNOSIS  A diagnosis is based on your medical history, your symptoms, results of your physical exam, and results of diagnostic tests. Diagnostic tests may include X-ray exams or a computerized magnetic scan (magnetic resonance imaging, MRI). TREATMENT  Treatment will depend on the cause of your ankle pain and may include:  Keeping pressure off the ankle and limiting activities.  Using crutches or other walking support (a cane or brace).  Using rest, ice, compression, and elevation.  Participating in physical therapy or home exercises.  Wearing shoe inserts or special shoes.  Losing weight.  Taking medications to reduce pain or swelling or receiving an injection.  Undergoing surgery. HOME CARE INSTRUCTIONS   Only take over-the-counter or prescription medicines for pain, discomfort, or fever as directed by your caregiver.  Put ice on the injured area.  Put ice in a plastic bag.  Place a towel between your skin and the bag.  Leave the ice on for 15-20 minutes at a time, 03-04 times a day.  Keep your leg raised (elevated) when possible to lessen swelling.  Avoid activities that cause ankle pain.  Follow specific exercises  as directed by your caregiver.  Record how often you have ankle pain, the location of the pain, and what it feels like. This information may be helpful to you and your caregiver.  Ask your caregiver about returning to work or sports and whether you should drive.  Follow up with your caregiver for further examination, therapy, or testing as directed. SEEK MEDICAL CARE IF:   Pain or swelling continues or worsens beyond 1 week.  You have an oral temperature above 102 F (38.9 C).  You are feeling unwell or have chills.  You are having an increasingly difficult time with walking.  You have loss of sensation or other new symptoms.  You have questions or concerns. MAKE SURE YOU:   Understand these instructions.  Will watch your condition.  Will get help right away if you are not doing well or get worse.   This information is not intended to replace  advice given to you by your health care provider. Make sure you discuss any questions you have with your health care provider.   Document Released: 01/30/2010 Document Revised: 11/04/2011 Document Reviewed: 03/14/2015 Elsevier Interactive Patient Education 2016 [employer]  Levofloxacin tablets What is this medicine? LEVOFLOXACIN (lee voe FLOX a sin) is a quinolone antibiotic. It is used to treat certain kinds of bacterial infections. It will not work for colds, flu, or other viral infections. This medicine may be used for other purposes; ask your health care provider or pharmacist if you have questions. What should I tell my health care provider before I take this medicine? They need to know if you have any of these conditions: -bone problems -cerebral disease -history of low levels of potassium in the blood -irregular heartbeat -joint problems -kidney disease -myasthenia gravis -seizures -tendon problems -tingling of the fingers or toes, or other nerve disorder -an unusual or allergic reaction to levofloxacin, other  quinolone antibiotics, foods, dyes, or preservatives -pregnant or trying to get pregnant -breast-feeding How should I use this medicine? Take this medicine by mouth with a full glass of water. Follow the directions on the prescription label. This medicine can be taken with or without food. Take your medicine at regular intervals. Do not take your medicine more often than directed. Do not skip doses or stop your medicine early even if you feel better. Do not stop taking except on your doctor's advice. A special MedGuide will be given to you by the pharmacist with each prescription and refill. Be sure to read this information carefully each time. Talk to your pediatrician regarding the use of this medicine in children. While this drug may be prescribed for children as young as 6 months for selected conditions, precautions do apply. Overdosage: If you think you have taken too much of this medicine contact a poison control center or emergency room at once. NOTE: This medicine is only for you. Do not share this medicine with others. What if I miss a dose? If you miss a dose, take it as soon as you remember. If it is almost time for your next dose, take only that dose. Do not take double or extra doses. What may interact with this medicine? Do not take this medicine with any of the following medications: -arsenic trioxide -chloroquine -droperidol -medicines for irregular heart rhythm like amiodarone, disopyramide, dofetilide, flecainide, quinidine, procainamide, sotalol -some medicines for depression or mental problems like phenothiazines, pimozide, and ziprasidone This medicine may also interact with the following medications: -amoxapine -antacids -birth control pills -cisapride -dairy products -didanosine (ddI) buffered tablets or powder -haloperidol -multivitamins -NSAIDS, medicines for pain and inflammation, like ibuprofen or naproxen -retinoid products like tretinoin or  isotretinoin -risperidone -some other antibiotics like clarithromycin or erythromycin -sucralfate -theophylline -warfarin This list may not describe all possible interactions. Give your health care provider a list of all the medicines, herbs, non-prescription drugs, or dietary supplements you use. Also tell them if you smoke, drink alcohol, or use illegal drugs. Some items may interact with your medicine. What should I watch for while using this medicine? Tell your doctor or health care professional if your symptoms do not improve or if they get worse. Drink several glasses of water a day and cut down on drinks that contain caffeine. You must not get dehydrated while taking this medicine. You may get drowsy or dizzy. Do not drive, use machinery, or do anything that needs mental alertness until you know how this medicine affects you.  Do not sit or stand up quickly, especially if you are an older patient. This reduces the risk of dizzy or fainting spells. This medicine can make you more sensitive to the sun. Keep out of the sun. If you cannot avoid being in the sun, wear protective clothing and use a sunscreen. Do not use sun lamps or tanning beds/booths. Contact your doctor if you get a sunburn. If you are a diabetic monitor your blood glucose carefully. If you get an unusual reading stop taking this medicine and call your doctor right away. Do not treat diarrhea with over-the-counter products. Contact your doctor if you have diarrhea that lasts more than 2 days or if the diarrhea is severe and watery. Avoid antacids, calcium, iron, and zinc products for 2 hours before and 2 hours after taking a dose of this medicine. What side effects may I notice from receiving this medicine? Side effects that you should report to your doctor or health care professional as soon as possible: -allergic reactions like skin rash or hives, swelling of the face, lips, or tongue -anxious -confusion -depressed  mood -diarrhea -fast, irregular heartbeat -hallucination, loss of contact with reality -joint, muscle, or tendon pain or swelling -pain, tingling, numbness in the hands or feet -suicidal thoughts or other mood changes -sunburn -unusually weak or tired Side effects that usually do not require medical attention (report to your doctor or health care professional if they continue or are bothersome): -dry mouth -headache -nausea -trouble sleeping This list may not describe all possible side effects. Call your doctor for medical advice about side effects. You may report side effects to FDA at 1-800-FDA-1088. Where should I keep my medicine? Keep out of the reach of children. Store at room temperature between 15 and 30 degrees C (59 and 86 degrees F). Keep in a tightly closed container. Throw away any unused medicine after the expiration date. NOTE: This sheet is a summary. It may not cover all possible information. If you have questions about this medicine, talk to your doctor, pharmacist, or health care provider.    2016, [employer]vier/[employer]. (2015-03-23 12:40:18)  Prednisone tablets What is this medicine? PREDNISONE (PRED ni sone) is a corticosteroid. It is commonly used to treat inflammation of the skin, joints, lungs, and other organs. Common conditions treated include asthma, allergies, and arthritis. It is also used for other conditions, such as blood disorders and diseases of the adrenal glands. This medicine may be used for other purposes; ask your health care provider or pharmacist if you have questions. What should I tell my health care provider before I take this medicine? They need to know if you have any of these conditions: -Cushing's syndrome -diabetes -glaucoma -heart disease -high blood pressure -infection (especially a virus infection such as chickenpox, cold sores, or herpes) -kidney disease -liver disease -mental illness -myasthenia  gravis -osteoporosis -seizures -stomach or intestine problems -thyroid disease -an unusual or allergic reaction to lactose, prednisone, other medicines, foods, dyes, or preservatives -pregnant or trying to get pregnant -breast-feeding How should I use this medicine? Take this medicine by mouth with a glass of water. Follow the directions on the prescription label. Take this medicine with food. If you are taking this medicine once a day, take it in the morning. Do not take more medicine than you are told to take. Do not suddenly stop taking your medicine because you may develop a severe reaction. Your doctor will tell you how much medicine to take. If your doctor wants you  to stop the medicine, the dose may be slowly lowered over time to avoid any side effects. Talk to your pediatrician regarding the use of this medicine in children. Special care may be needed. Overdosage: If you think you have taken too much of this medicine contact a poison control center or emergency room at once. NOTE: This medicine is only for you. Do not share this medicine with others. What if I miss a dose? If you miss a dose, take it as soon as you can. If it is almost time for your next dose, talk to your doctor or health care professional. You may need to miss a dose or take an extra dose. Do not take double or extra doses without advice. What may interact with this medicine? Do not take this medicine with any of the following medications: -metyrapone -mifepristone This medicine may also interact with the following medications: -aminoglutethimide -amphotericin B -aspirin and aspirin-like medicines -barbiturates -certain medicines for diabetes, like glipizide or glyburide -cholestyramine -cholinesterase inhibitors -cyclosporine -digoxin -diuretics -ephedrine -female hormones, like estrogens and birth control pills -isoniazid -ketoconazole -NSAIDS, medicines for pain and inflammation, like ibuprofen or  naproxen -phenytoin -rifampin -toxoids -vaccines -warfarin This list may not describe all possible interactions. Give your health care provider a list of all the medicines, herbs, non-prescription drugs, or dietary supplements you use. Also tell them if you smoke, drink alcohol, or use illegal drugs. Some items may interact with your medicine. What should I watch for while using this medicine? Visit your doctor or health care professional for regular checks on your progress. If you are taking this medicine over a prolonged period, carry an identification card with your name and address, the type and dose of your medicine, and your doctor's name and address. This medicine may increase your risk of getting an infection. Tell your doctor or health care professional if you are around anyone with measles or chickenpox, or if you develop sores or blisters that do not heal properly. If you are going to have surgery, tell your doctor or health care professional that you have taken this medicine within the last twelve months. Ask your doctor or health care professional about your diet. You may need to lower the amount of salt you eat. This medicine may affect blood sugar levels. If you have diabetes, check with your doctor or health care professional before you change your diet or the dose of your diabetic medicine. What side effects may I notice from receiving this medicine? Side effects that you should report to your doctor or health care professional as soon as possible: -allergic reactions like skin rash, itching or hives, swelling of the face, lips, or tongue -changes in emotions or moods -changes in vision -depressed mood -eye pain -fever or chills, cough, sore throat, pain or difficulty passing urine -increased thirst -swelling of ankles, feet Side effects that usually do not require medical attention (report to your doctor or health care professional if they continue or are  bothersome): -confusion, excitement, restlessness -headache -nausea, vomiting -skin problems, acne, thin and shiny skin -trouble sleeping -weight gain This list may not describe all possible side effects. Call your doctor for medical advice about side effects. You may report side effects to FDA at 1-800-FDA-1088. Where should I keep my medicine? Keep out of the reach of children. Store at room temperature between 15 and 30 degrees C (59 and 86 degrees F). Protect from light. Keep container tightly closed. Throw away any unused medicine after the expiration  date. NOTE: This sheet is a summary. It may not cover all possible information. If you have questions about this medicine, talk to your doctor, pharmacist, or health care provider.    2016, [employer]. (2011-03-28 10:57:14)

## 2015-10-11 NOTE — ED Notes (Signed)
Pt verbalized understanding of d/c instructions and has no further questions. Pt stable and NAD. Pt d/c home with daughter's driving.

## 2015-11-25 DEATH — deceased

## 2017-07-09 IMAGING — DX DG ANKLE COMPLETE 3+V*L*
3 series · 3 of 3 positions shown · non-contrast
Comparison: Radiograph dated 10/02/2013

CLINICAL DATA: [AGE] female with left ankle pain.

EXAM:
LEFT ANKLE COMPLETE - 3+ VIEW

[ankle ap]
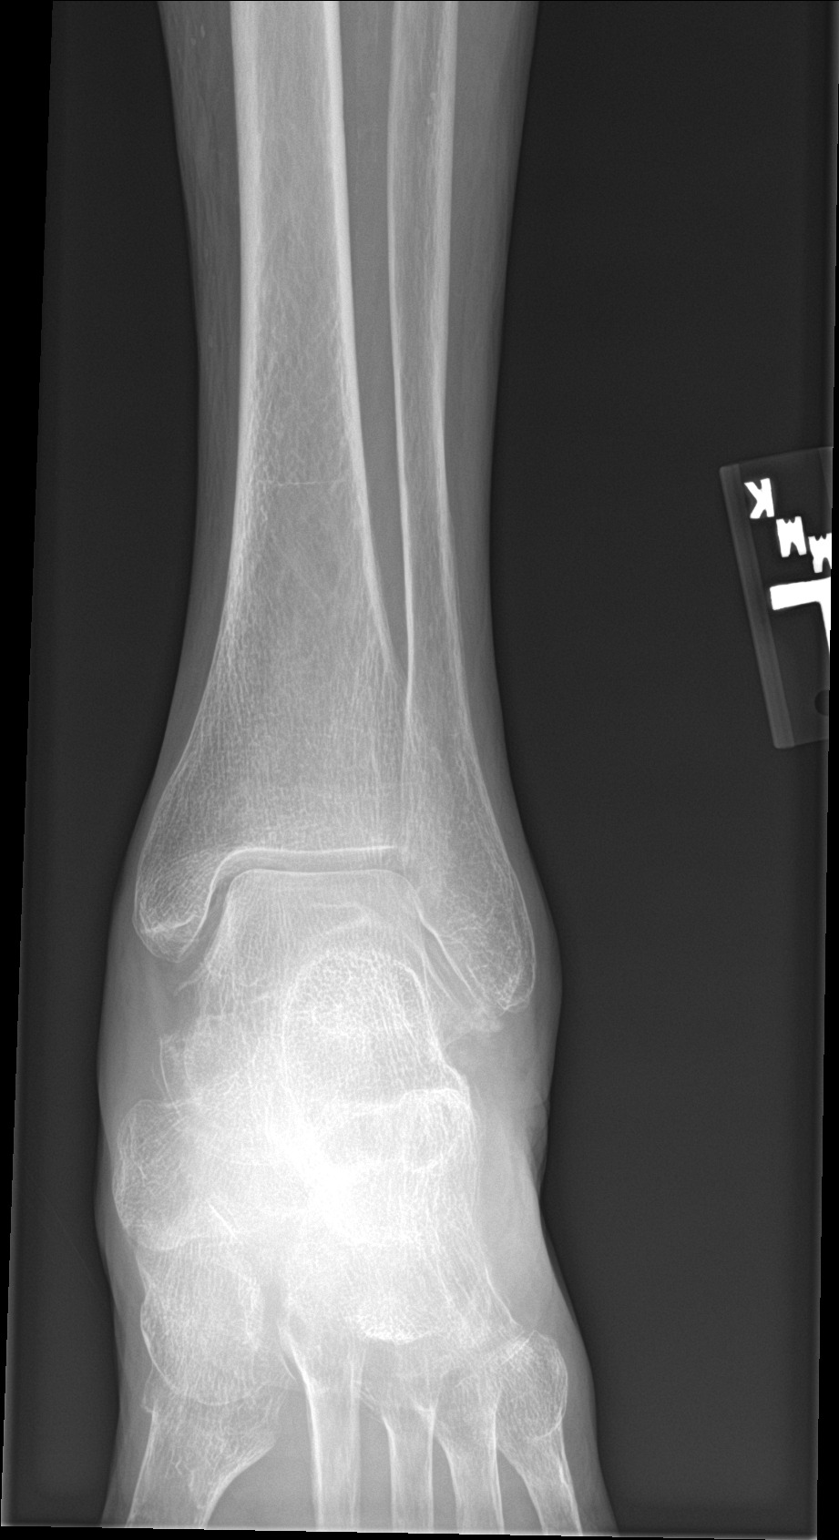

[ankle obl]
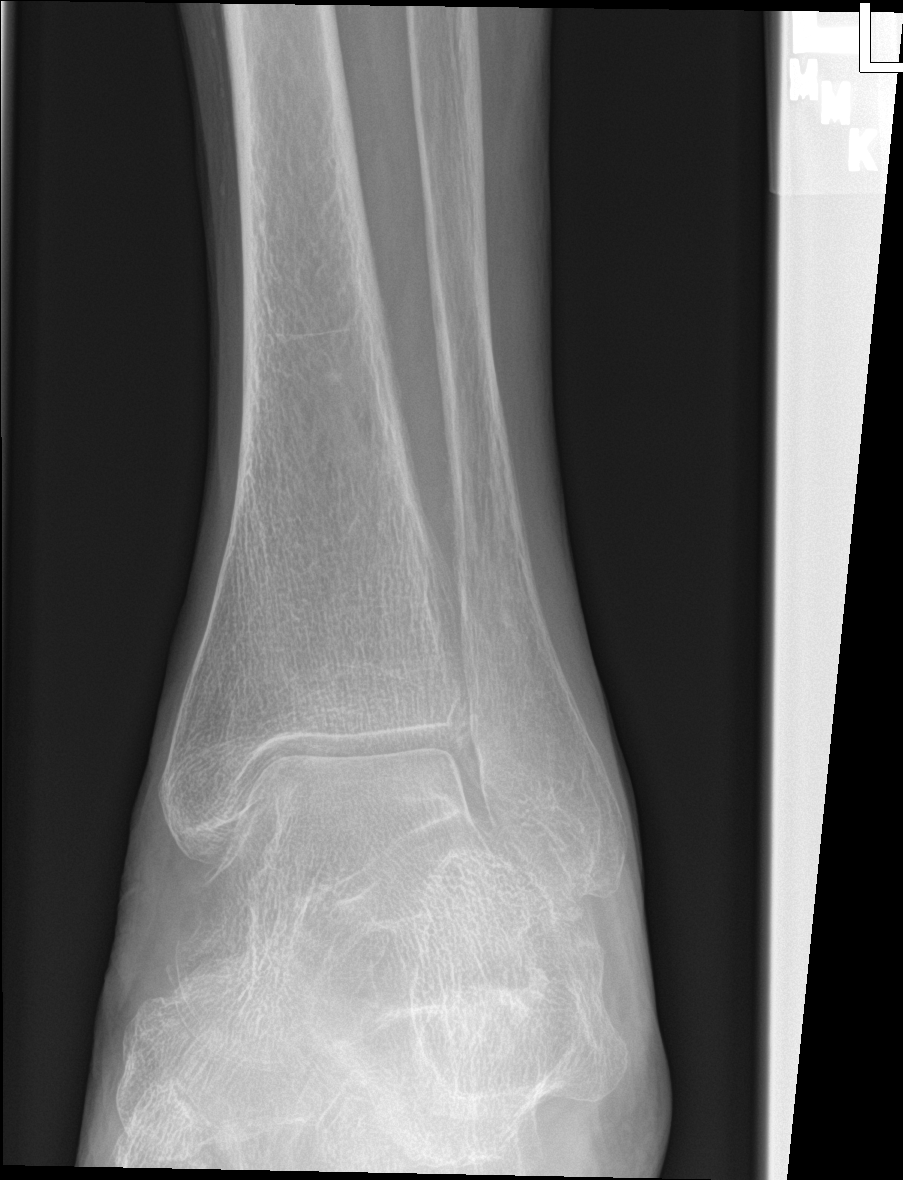

[ankle lat]
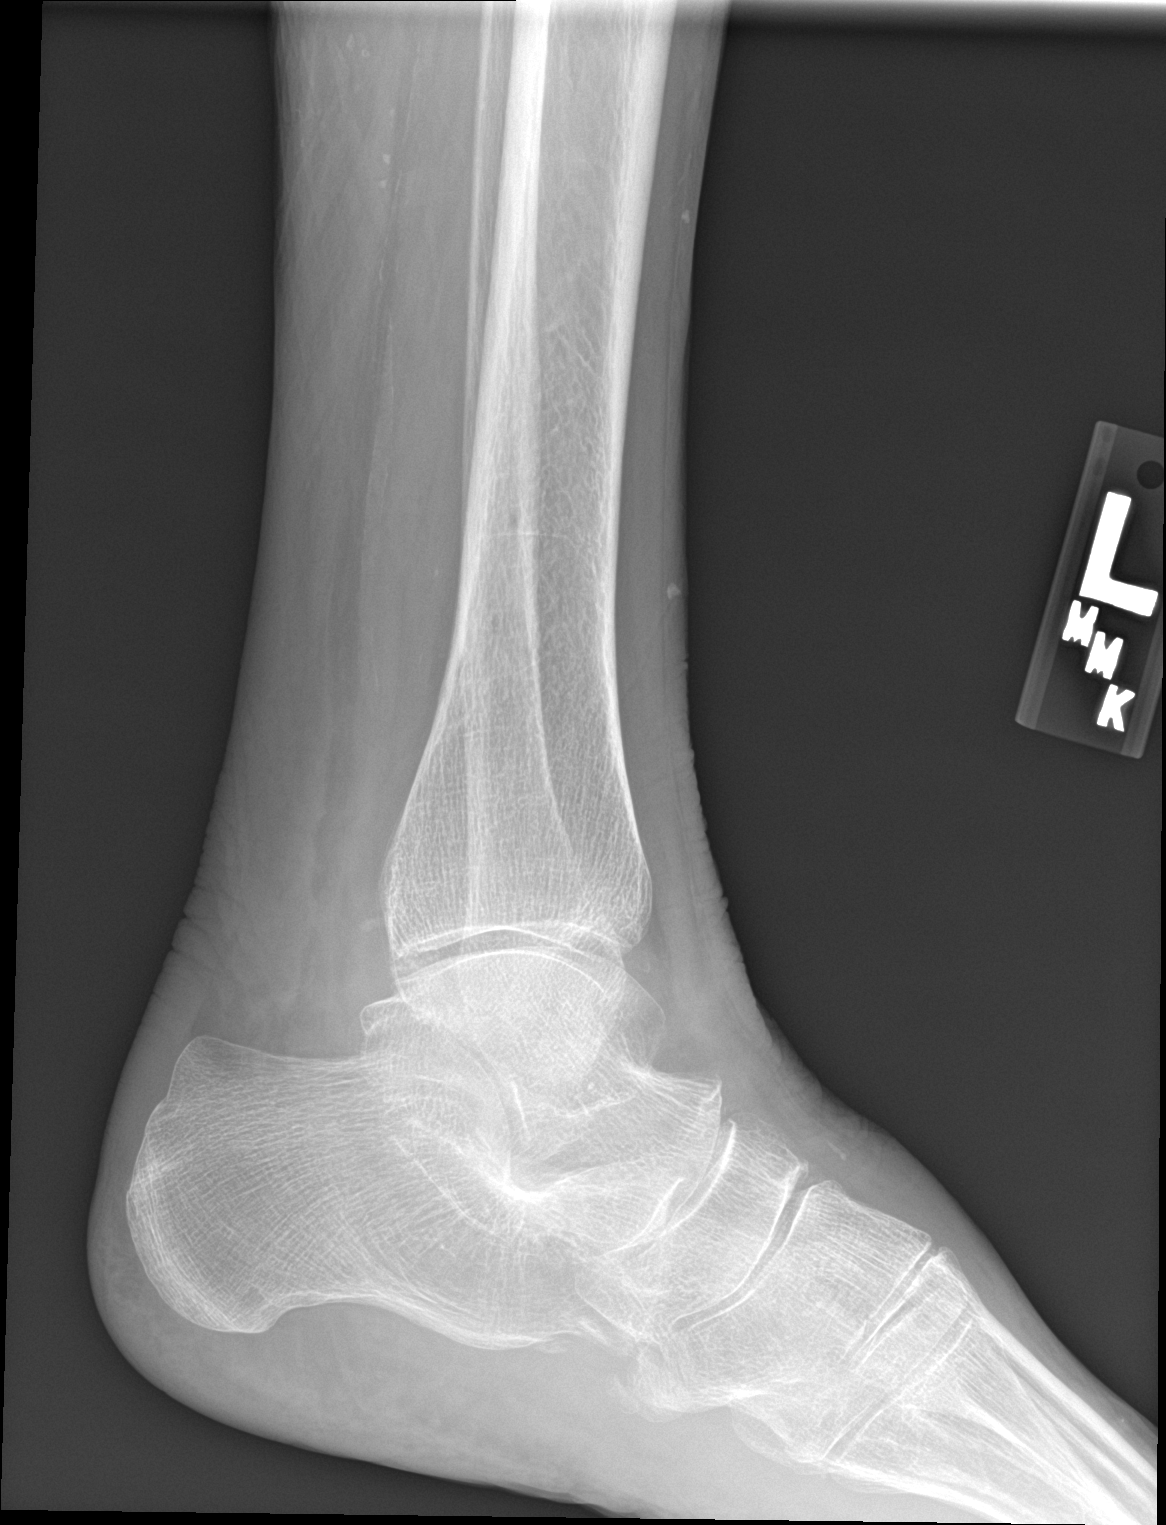

[3 of 3 positions shown; findings below may reference images not displayed]

FINDINGS: There is no acute fracture or dislocation. The bones are osteopenic.
The soft tissues are grossly unremarkable.
IMPRESSION: No acute fracture or dislocation.
